# Patient Record
Sex: Female | Born: 1950
Health system: Southern US, Community
[De-identification: ages and names within clinical notes are randomized; demographics above are authoritative.]

## PROBLEM LIST (undated history)

## (undated) DIAGNOSIS — G8929 Other chronic pain: Secondary | ICD-10-CM

## (undated) DIAGNOSIS — E119 Type 2 diabetes mellitus without complications: Secondary | ICD-10-CM

## (undated) DIAGNOSIS — E1140 Type 2 diabetes mellitus with diabetic neuropathy, unspecified: Secondary | ICD-10-CM

## (undated) DIAGNOSIS — I2510 Atherosclerotic heart disease of native coronary artery without angina pectoris: Secondary | ICD-10-CM

## (undated) DIAGNOSIS — K219 Gastro-esophageal reflux disease without esophagitis: Secondary | ICD-10-CM

## (undated) DIAGNOSIS — E785 Hyperlipidemia, unspecified: Secondary | ICD-10-CM

## (undated) HISTORY — DX: Atherosclerotic heart disease of native coronary artery without angina pectoris: I25.10

## (undated) HISTORY — DX: Gastro-esophageal reflux disease without esophagitis: K21.9

## (undated) HISTORY — DX: Type 2 diabetes mellitus without complications: E11.9

## (undated) HISTORY — DX: Other chronic pain: G89.29

## (undated) HISTORY — DX: Hyperlipidemia, unspecified: E78.5

## (undated) HISTORY — DX: Type 2 diabetes mellitus with diabetic neuropathy, unspecified: E11.40

---

## 1991-03-18 HISTORY — PX: CHOLECYSTECTOMY: SHX55

## 2016-01-01 DIAGNOSIS — M5000 Cervical disc disorder with myelopathy, unspecified cervical region: Secondary | ICD-10-CM | POA: Insufficient documentation

## 2016-01-01 DIAGNOSIS — M79605 Pain in left leg: Secondary | ICD-10-CM | POA: Insufficient documentation

## 2016-01-01 DIAGNOSIS — E1142 Type 2 diabetes mellitus with diabetic polyneuropathy: Secondary | ICD-10-CM | POA: Insufficient documentation

## 2016-07-31 DIAGNOSIS — G8929 Other chronic pain: Secondary | ICD-10-CM | POA: Insufficient documentation

## 2016-07-31 DIAGNOSIS — M25512 Pain in left shoulder: Secondary | ICD-10-CM | POA: Insufficient documentation

## 2017-02-26 DIAGNOSIS — Z79891 Long term (current) use of opiate analgesic: Secondary | ICD-10-CM | POA: Insufficient documentation

## 2017-05-28 DIAGNOSIS — M5106 Intervertebral disc disorders with myelopathy, lumbar region: Secondary | ICD-10-CM | POA: Insufficient documentation

## 2019-01-10 DIAGNOSIS — R296 Repeated falls: Secondary | ICD-10-CM | POA: Insufficient documentation

## 2019-01-10 DIAGNOSIS — R42 Dizziness and giddiness: Secondary | ICD-10-CM | POA: Insufficient documentation

## 2019-01-10 DIAGNOSIS — I6782 Cerebral ischemia: Secondary | ICD-10-CM | POA: Insufficient documentation

## 2019-04-22 ENCOUNTER — Telehealth: Payer: Self-pay

## 2019-04-22 ENCOUNTER — Encounter: Payer: Self-pay | Admitting: Primary Care

## 2019-04-22 ENCOUNTER — Other Ambulatory Visit: Payer: Self-pay

## 2019-04-22 ENCOUNTER — Ambulatory Visit (INDEPENDENT_AMBULATORY_CARE_PROVIDER_SITE_OTHER): Payer: Medicare Other | Admitting: Primary Care

## 2019-04-22 VITALS — BP 128/72 | HR 75 | Temp 96.5°F | Ht 63.0 in | Wt 249.0 lb

## 2019-04-22 DIAGNOSIS — I2510 Atherosclerotic heart disease of native coronary artery without angina pectoris: Secondary | ICD-10-CM | POA: Insufficient documentation

## 2019-04-22 DIAGNOSIS — M545 Low back pain: Secondary | ICD-10-CM

## 2019-04-22 DIAGNOSIS — E1149 Type 2 diabetes mellitus with other diabetic neurological complication: Secondary | ICD-10-CM | POA: Diagnosis not present

## 2019-04-22 DIAGNOSIS — E785 Hyperlipidemia, unspecified: Secondary | ICD-10-CM | POA: Diagnosis not present

## 2019-04-22 DIAGNOSIS — E119 Type 2 diabetes mellitus without complications: Secondary | ICD-10-CM | POA: Insufficient documentation

## 2019-04-22 DIAGNOSIS — K219 Gastro-esophageal reflux disease without esophagitis: Secondary | ICD-10-CM

## 2019-04-22 DIAGNOSIS — Z794 Long term (current) use of insulin: Secondary | ICD-10-CM

## 2019-04-22 DIAGNOSIS — E1169 Type 2 diabetes mellitus with other specified complication: Secondary | ICD-10-CM | POA: Diagnosis not present

## 2019-04-22 DIAGNOSIS — G8929 Other chronic pain: Secondary | ICD-10-CM

## 2019-04-22 DIAGNOSIS — M5442 Lumbago with sciatica, left side: Secondary | ICD-10-CM | POA: Insufficient documentation

## 2019-04-22 DIAGNOSIS — E1140 Type 2 diabetes mellitus with diabetic neuropathy, unspecified: Secondary | ICD-10-CM | POA: Insufficient documentation

## 2019-04-22 LAB — LIPID PANEL
Cholesterol: 183 mg/dL (ref 0–200)
HDL: 50.9 mg/dL (ref >39.00)
NonHDL: 131.85
Total CHOL/HDL Ratio: 4
Triglycerides: 342 mg/dL — ABNORMAL HIGH (ref 0.0–149.0)
VLDL: 68.4 mg/dL — ABNORMAL HIGH (ref 0.0–40.0)

## 2019-04-22 LAB — COMPREHENSIVE METABOLIC PANEL
ALT: 15 U/L (ref 0–35)
AST: 12 U/L (ref 0–37)
Albumin: 3.9 g/dL (ref 3.5–5.2)
Alkaline Phosphatase: 71 U/L (ref 39–117)
BUN: 26 mg/dL — ABNORMAL HIGH (ref 6–23)
CO2: 31 mEq/L (ref 19–32)
Calcium: 9.4 mg/dL (ref 8.4–10.5)
Chloride: 103 mEq/L (ref 96–112)
Creatinine, Ser: 0.86 mg/dL (ref 0.40–1.20)
GFR: 65.4 mL/min (ref >60.00)
Glucose, Bld: 205 mg/dL — ABNORMAL HIGH (ref 70–99)
Potassium: 4.4 mEq/L (ref 3.5–5.1)
Sodium: 142 mEq/L (ref 135–145)
Total Bilirubin: 0.2 mg/dL (ref 0.2–1.2)
Total Protein: 6.7 g/dL (ref 6.0–8.3)

## 2019-04-22 LAB — HEMOGLOBIN A1C: Hgb A1c MFr Bld: 6.7 % — ABNORMAL HIGH (ref 4.6–6.5)

## 2019-04-22 LAB — CBC
HCT: 38.1 % (ref 36.0–46.0)
Hemoglobin: 12.8 g/dL (ref 12.0–15.0)
MCHC: 33.7 g/dL (ref 30.0–36.0)
MCV: 96.3 fl (ref 78.0–100.0)
Platelets: 201 10*3/uL (ref 150.0–400.0)
RBC: 3.95 Mil/uL (ref 3.87–5.11)
RDW: 13.6 % (ref 11.5–15.5)
WBC: 7.6 10*3/uL (ref 4.0–10.5)

## 2019-04-22 LAB — MICROALBUMIN / CREATININE URINE RATIO
Creatinine,U: 144.7 mg/dL
Microalb Creat Ratio: 4.2 mg/g (ref 0.0–30.0)
Microalb, Ur: 6.1 mg/dL — ABNORMAL HIGH (ref 0.0–1.9)

## 2019-04-22 LAB — LDL CHOLESTEROL, DIRECT: Direct LDL: 88 mg/dL

## 2019-04-22 NOTE — Progress Notes (Signed)
Subjective:    Patient ID: Michelle Kim, female    DOB: January 12, 1951, 69 y.o.   MRN: [id number]  HPI  This visit occurred during the SARS-CoV-2 public health emergency.  Safety protocols were in place, including screening questions prior to the visit, additional usage of staff PPE, and extensive cleaning of exam room while observing appropriate contact time as indicated for disinfecting solutions.   Michelle Kim is a 69 year old female who presents today to establish care and discuss the problems mentioned below. Will obtain/review records. She is with her daughter who is helping with HPI. Patient doesn't have her medications with her today, moved from [place].  1) Type 2 Diabetes: Diagnosed a few years ago. Currently managed on Trulicity 1.5 mg weekly, Glimepiride 4 mg daily, Actos 30 mg daily, Levemir 10 units daily, insulin Lispro 30 units PRN.   She is not currently using her Levemir. She's using her Lispro infrequently.  She is checking her blood sugars three times daily and is getting readings of:  AM fasting: 170's Before lunch: 140's Bedtime: 120's  Today she checked her blood sugar 30 minutes after eating breakfast which was 330. She ate biscuits. She is also taking carbamazepine 300 mg twice daily for diabetic neuropathy. She cannot take gabapentin due "allery to lactose".   2) Hyperlipidemia: Currently managed on rosuvastatin 10 mg, previously taking Zetia, no longer taking.  3) Chronic Pain: Chronic to upper and lower back. Following with neurology in [place]. Previously managed on Lidocaine patches. Now taking Percocet 10-325 mg every four hours, duloxetine 60 mg BID, meloxicam 7.5 mg BID.  4) CAD: MI in 2003, one stent placed. She is managed on carvedilol 6.25 mg BID. No problems since 2003. She was not seeing cardiology in [place].    5) GERD: Chronic symptoms and is managed on omeprazole 40 mg.   BP Readings from Last 3 Encounters:  04/22/19 128/72     Review of  Systems  Eyes: Negative for visual disturbance.  Respiratory: Negative for shortness of breath.   Cardiovascular: Negative for chest pain.  Musculoskeletal: Positive for arthralgias and back pain.  Neurological: Negative for dizziness and headaches.       No past medical history on file.   Social History   Socioeconomic History  . Marital status: Widowed    Spouse name: Not on file  . Number of children: Not on file  . Years of education: Not on file  . Highest education level: Not on file  Occupational History  . Not on file  Tobacco Use  . Smoking status: Former Research scientist (life sciences)  . Smokeless tobacco: Never Used  Substance and Sexual Activity  . Alcohol use: Not on file  . Drug use: Not on file  . Sexual activity: Not on file  Other Topics Concern  . Not on file  Social History Narrative  . Not on file   Social Determinants of Health   Financial Resource Strain:   . Difficulty of Paying Living Expenses: Not on file  Food Insecurity:   . Worried About Charity fundraiser in the Last Year: Not on file  . Ran Out of Food in the Last Year: Not on file  Transportation Needs:   . Lack of Transportation (Medical): Not on file  . Lack of Transportation (Non-Medical): Not on file  Physical Activity:   . Days of Exercise per Week: Not on file  . Minutes of Exercise per Session: Not on file  Stress:   .  Feeling of Stress : Not on file  Social Connections:   . Frequency of Communication with Friends and Family: Not on file  . Frequency of Social Gatherings with Friends and Family: Not on file  . Attends Religious Services: Not on file  . Active Member of Clubs or Organizations: Not on file  . Attends Archivist Meetings: Not on file  . Marital Status: Not on file  Intimate Partner Violence:   . Fear of Current or Ex-Partner: Not on file  . Emotionally Abused: Not on file  . Physically Abused: Not on file  . Sexually Abused: Not on file    No family history on  file.  Allergies  Allergen Reactions  . Aspirin   . Codeine   . Latex     Current Outpatient Medications on File Prior to Visit  Medication Sig Dispense Refill  . Blood Glucose Monitoring Suppl (ONETOUCH VERIO) w/Device KIT by Does not apply route.    . calcipotriene (DOVONOX) 0.005 % ointment APPLY AND GENTLY MASSAGE INTO AFFECTED AREA TWICE DAILY    . carbamazepine (CARBATROL) 300 MG 12 hr capsule Take 300 mg by mouth daily.    . carvedilol (COREG) 6.25 MG tablet Take 6.25 mg by mouth 2 (two) times daily with a meal.    . Cholecalciferol (VITAMIN D3) 50 MCG (2000 UT) TABS Take 2,000 Units by mouth daily.    Marland Kitchen docusate sodium (COLACE) 100 MG capsule Take 100 mg by mouth 2 (two) times daily.    . Dulaglutide (TRULICITY) 1.5 GB/1.5VV SOPN Inject into the skin.    . DULoxetine (CYMBALTA) 60 MG capsule Take 120 mg by mouth at bedtime.    . fexofenadine (ALLEGRA) 180 MG tablet Take 180 mg by mouth daily.    Marland Kitchen glimepiride (AMARYL) 4 MG tablet Take 4 mg by mouth daily with breakfast.    . Insulin Pen Needle (RELION MINI PEN NEEDLES) 31G X 6 MM MISC by Does not apply route. Use 3 pen needles daily    . Lancet Devices (ONETOUCH DELICA PLUS LANCING) MISC by Does not apply route. USE AS DIRECTED 4 TIMES DAILY    . lidocaine (LIDODERM) 5 % APPLY 1 PATCH TO THE AFFECTED AREA AND LEAVE IN PLACE FOR 12 HOURS,  THEN REMOVE AND LEAVE OFF FOR 12 HOURS    . meloxicam (MOBIC) 7.5 MG tablet Take 7.5 mg by mouth 2 (two) times daily as needed for pain.    Marland Kitchen omega-3 acid ethyl esters (LOVAZA) 1 g capsule Take 2 g by mouth 2 (two) times daily.    Marland Kitchen omeprazole (PRILOSEC) 40 MG capsule Take 40 mg by mouth daily.    Marland Kitchen oxyCODONE-acetaminophen (PERCOCET) 10-325 MG tablet Take 1 tablet by mouth every 4 (four) hours as needed.    . pioglitazone (ACTOS) 30 MG tablet Take 30 mg by mouth daily.    . rosuvastatin (CRESTOR) 10 MG tablet Take 10 mg by mouth daily.    Marland Kitchen tiZANidine (ZANAFLEX) 4 MG tablet Take 4 mg by mouth  every 6 (six) hours as needed.    . insulin lispro (HUMALOG) 100 UNIT/ML injection Inject 10 Units into the skin 3 (three) times daily before meals.     No current facility-administered medications on file prior to visit.    BP 128/72   Pulse 75   Temp (!) 96.5 F (35.8 C) (Temporal)   Ht '5\' 3"'  (1.6 m)   Wt 249 lb (112.9 kg)   SpO2 95%  BMI 44.11 kg/m    Objective:   Physical Exam  Constitutional: She appears well-nourished.  Cardiovascular: Normal rate and regular rhythm.  Respiratory: Effort normal and breath sounds normal.  Musculoskeletal:     Cervical back: Neck supple.  Skin: Skin is warm and dry.  Psychiatric: She has a normal mood and affect.           Assessment & Plan:

## 2019-04-22 NOTE — Assessment & Plan Note (Signed)
Chronic, on high dose of PPI daily, cannot (and refuses) to reduce dose. Continue to monitor with a goal of dose reduction.

## 2019-04-22 NOTE — Telephone Encounter (Signed)
Error

## 2019-04-22 NOTE — Assessment & Plan Note (Signed)
History of MI in 2003, stent placed. Compliant to carvedilol, continue same.  Asymptomatic.

## 2019-04-22 NOTE — Telephone Encounter (Signed)
Noted and appreciate the update! 

## 2019-04-22 NOTE — Assessment & Plan Note (Signed)
Managed on carbamazepine 300 mg BID. Continue same for now. Could not tolerate gabapentin due to "lactose" allergy.

## 2019-04-22 NOTE — Assessment & Plan Note (Signed)
Compliant to rosuvastatin, no longer on Zetia. Repeat lipids pending.

## 2019-04-22 NOTE — Patient Instructions (Signed)
Stop by the lab prior to leaving today. I will notify you of your results once received.   You will be contacted regarding your referral to pain management.  Please let us know if you have not been contacted within two weeks.   It was a pleasure to meet you today! Please don't hesitate to call or message me with any questions. Welcome to [facility]!

## 2019-04-22 NOTE — Assessment & Plan Note (Addendum)
A1C, Lipids, urine microalbumin pending. Compliant to Actos, glimepiride, Trulicity. Unclear why she's just using Lispro and not longer acting insulin for control.  Consider stopping Lispro, adding back Levemir. Await labs.

## 2019-04-22 NOTE — Telephone Encounter (Signed)
Patient's daughter contacted the office and said that Jae Dire asked her to contact the office to let us know the dose of her Trulicity which is 1.5mg /.45ml - single dose pen once a week. Will route to [place]. Thanks!

## 2019-04-22 NOTE — Assessment & Plan Note (Addendum)
On scheduled opioid therapy, referral placed to pain management.   Continue duloxetine, tizanidine, meloxicam for now. CMP pending.

## 2019-04-25 ENCOUNTER — Telehealth: Payer: Self-pay | Admitting: Primary Care

## 2019-04-25 NOTE — Telephone Encounter (Signed)
Rec'd from [facility] forwarded 33 pages to Dr. Vernona Rieger

## 2019-04-27 ENCOUNTER — Telehealth: Payer: Self-pay | Admitting: Primary Care

## 2019-04-27 NOTE — Telephone Encounter (Signed)
Please notify patient:  Labs show that diabetes is overall controlled so I want her to continue taking Glimepiride, Trulicity, Actos. I don't want her using the Lispro insulin, have her stop this. If her blood sugars are consistently at or above 200 then I recommend we resume her Levemir. Have them update me if this occurs.  Urine test was negative for kidney damage. Cholesterol looks good. Continue rosuvastatin. Blood counts look good.  I want to see her back in 3 months for diabetes follow up. Please schedule. Have them update me sooner regarding blood sugars >200.

## 2019-04-27 NOTE — Telephone Encounter (Signed)
Message left for patient to return my call.  

## 2019-04-29 ENCOUNTER — Other Ambulatory Visit: Payer: Self-pay | Admitting: Primary Care

## 2019-04-29 DIAGNOSIS — Z0000 Encounter for general adult medical examination without abnormal findings: Secondary | ICD-10-CM

## 2019-04-29 NOTE — Telephone Encounter (Signed)
Spoken and notified patient of Michelle Kim comments. Patient verbalized understanding. Patient's daughter stated that she will call to schedule the follow up after she check her work schedule

## 2019-05-11 ENCOUNTER — Telehealth: Payer: Self-pay

## 2019-05-11 DIAGNOSIS — E1169 Type 2 diabetes mellitus with other specified complication: Secondary | ICD-10-CM

## 2019-05-11 DIAGNOSIS — M545 Low back pain, unspecified: Secondary | ICD-10-CM

## 2019-05-11 DIAGNOSIS — G8929 Other chronic pain: Secondary | ICD-10-CM

## 2019-05-11 NOTE — Telephone Encounter (Signed)
Pt requesting refills pioglitazone 30 mg and glimepiride 4 mg to [facility]. Pt will be out of oxycodone apap 10-325 mg  In  4- 5 days. Pt does not have appt with pain mgt until 05/24/19 and pt wants to know if can refill oxycodone apap until pt sees pain mgt. [facility] pharmacy

## 2019-05-11 NOTE — Telephone Encounter (Signed)
Please advise 

## 2019-05-12 MED ORDER — PIOGLITAZONE HCL 30 MG PO TABS: 30.0000 mg | ORAL_TABLET | Freq: Every day | ORAL | 3 refills | Status: AC

## 2019-05-12 MED ORDER — OXYCODONE-ACETAMINOPHEN 10-325 MG PO TABS: 1.0000 | ORAL_TABLET | Freq: Three times a day (TID) | ORAL | 0 refills | Status: DC | PRN

## 2019-05-12 MED ORDER — GLIMEPIRIDE 4 MG PO TABS: 4.0000 mg | ORAL_TABLET | Freq: Every day | ORAL | 3 refills | Status: AC

## 2019-05-12 NOTE — Telephone Encounter (Signed)
Noted, refill sent to pharmacy. Reviewed PMP aware site for [place], no suspicious activity. Nothing on [place] PMP database.

## 2019-05-12 NOTE — Telephone Encounter (Signed)
Spoken and notified patient of Michelle Kim comments. Patient verbalized understanding and takes the oxycodone-acetaminophen 3 times a day.

## 2019-05-12 NOTE — Telephone Encounter (Signed)
I sent refills for pioglitazone and glimepiride to your pharmacy. I will temporarily refill her oxycodone-acetaminophen until she can get in with pain management. How many tablets of her oxycodone-acetaminophen that she take in 1 day?  2?  3?

## 2019-05-23 ENCOUNTER — Telehealth: Payer: Self-pay | Admitting: Primary Care

## 2019-05-23 ENCOUNTER — Other Ambulatory Visit: Payer: Self-pay | Admitting: Primary Care

## 2019-05-23 DIAGNOSIS — Z794 Long term (current) use of insulin: Secondary | ICD-10-CM

## 2019-05-23 DIAGNOSIS — E1169 Type 2 diabetes mellitus with other specified complication: Secondary | ICD-10-CM

## 2019-05-23 MED ORDER — TRULICITY 1.5 MG/0.5ML ~~LOC~~ SOAJ: 1.5000 mg | SUBCUTANEOUS | 3 refills | Status: DC

## 2019-05-23 NOTE — Telephone Encounter (Signed)
Have not prescribed. Last appointment on 04/22/2019 (est care). No future appointment

## 2019-05-23 NOTE — Progress Notes (Signed)
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 lb 8.3 oz) Adjusted ideal body weight: 75.9 kg (167 lb 5 oz)   BMI Readings from Last 4 Encounters:  05/24/19 43.40 kg/m  04/22/19 44.11 kg/m   Wt Readings from Last 4 Encounters:  05/24/19 245 lb (111.1 kg)  04/22/19 249 lb (  112.9 kg)    Psych/Mental status: Alert, oriented x 3 (person, place, & time)       Eyes: PERLA Respiratory: No evidence of acute respiratory distress  Cervical Spine Exam  Skin & Axial Inspection: No masses, redness, edema, swelling, or associated skin lesions Alignment: Symmetrical Functional ROM: Pain restricted ROM, bilaterally Stability: No instability detected Muscle Tone/Strength: Guarding observed Sensory (Neurological): Movement-associated pain Palpation: Complains of area being tender to palpation              Upper Extremity (UE) Exam    Side: Right upper extremity  Side: Left upper extremity  Skin & Extremity Inspection: Skin color, temperature, and hair growth are WNL. No peripheral edema or cyanosis. No masses, redness, swelling, asymmetry, or associated skin lesions. No  contractures.  Skin & Extremity Inspection: Skin color, temperature, and hair growth are WNL. No peripheral edema or cyanosis. No masses, redness, swelling, asymmetry, or associated skin lesions. No contractures.  Functional ROM: Decreased ROM for shoulder  Functional ROM: Decreased ROM for shoulder  Muscle Tone/Strength: Functionally intact. No obvious neuro-muscular anomalies detected.  Muscle Tone/Strength: Functionally intact. No obvious neuro-muscular anomalies detected.  Sensory (Neurological): Movement-associated discomfort affecting the shoulder  Sensory (Neurological): Movement-associated pain affecting the shoulder  Palpation: Complains of area being tender to palpation              Palpation: Complains of area being tender to palpation              Provocative Test(s):  Phalen's test: deferred Tinel's test: deferred Apley's scratch test (touch opposite shoulder):  Action 1 (Across chest): deferred Action 2 (Overhead): deferred Action 3 (LB reach): deferred   Provocative Test(s):  Phalen's test: deferred Tinel's test: deferred Apley's scratch test (touch opposite shoulder):  Action 1 (Across chest): deferred Action 2 (Overhead): deferred Action 3 (LB reach): deferred    Thoracic Spine Area Exam  Skin & Axial Inspection: No masses, redness, or swelling Alignment: Symmetrical Functional ROM: Unrestricted ROM Stability: No instability detected Muscle Tone/Strength: Functionally intact. No obvious neuro-muscular anomalies detected. Sensory (Neurological): Movement associated discomfort Muscle strength & Tone: Complains of area being tender to palpation  Lumbar Exam  Skin & Axial Inspection: No masses, redness, or swelling Alignment: Symmetrical Functional ROM: Guarding affecting both sides Stability: No instability detected Muscle Tone/Strength: Functionally intact. No obvious neuro-muscular anomalies detected. Sensory (Neurological): Unimpaired Palpation: Complains of area  being tender to palpation       Provocative Tests: Hyperextension/rotation test: Positive bilaterally for facet joint pain. Lumbar quadrant test (Kemp's test): (+) on the left for facet joint pain. Lateral bending test: deferred today       Patrick's Maneuver: deferred today                   FABER* test: deferred today                   S-I anterior distraction/compression test: deferred today         S-I lateral compression test: deferred today         S-I Thigh-thrust test: deferred today         S-I Gaenslen's test: deferred today         *(Flexion, ABduction and External Rotation)  Gait & Posture Assessment  Ambulation: Patient came in today in a wheel chair Gait: Antalgic Posture: Difficulty standing up straight, due to pain   Lower Extremity Exam    Side: Right lower extremity  Side: Left  lower extremity  Stability: No instability observed          Stability: No instability observed          Skin & Extremity Inspection: Skin color, temperature, and hair growth are WNL. No peripheral edema or cyanosis. No masses, redness, swelling, asymmetry, or associated skin lesions. No contractures.  Skin & Extremity Inspection: Skin color, temperature, and hair growth are WNL. No peripheral edema or cyanosis. No masses, redness, swelling, asymmetry, or associated skin lesions. No contractures.  Functional ROM: Decreased ROM for hip and knee joints          Functional ROM: Decreased ROM for hip and knee joints          Muscle Tone/Strength: S1 myotomal weakness (Foot Plantar Flexion)  Muscle Tone/Strength: S1 myotomal weakness (Foot Plantar Flexion)  Sensory (Neurological): Movement-associated discomfort        Sensory (Neurological): Movement-associated discomfort        DTR: Patellar: 2+: normal Achilles: 0: absent Plantar: deferred today  DTR: Patellar: 2+: normal Achilles: 0: absent Plantar: deferred today  Palpation: No palpable anomalies  Palpation: No palpable anomalies    Assessment  Primary Diagnosis & Pertinent Problem List: The primary encounter diagnosis was Chronic low back pain (1ry area of Pain) (Bilateral) (L>R) w/ sciatica (Bilateral). 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), Chronic lower extremity pain (3ry area of Pain) (Bilateral) (L>R), Chronic hip pain (Bilateral) (L>R), Chronic knee pain (Bilateral) (L>R), Osteoarthritis of knees (Medial Compartment) (Bilateral), Chronic neck and back pain (4th area of Pain) (Bilateral) (L>R), DDD (degenerative disc disease), cervical, Cervicalgia, Cervical Grade 1 Anterolisthesis of C4/C5 (2 mm), Subluxation of C4/C5 cervical vertebrae, sequela (Bilateral) (L>R), Cervical facet hypertrophy (C5-6) (Left), Cervical facet arthropathy (C4-5) (Bilateral) (L>R), Occipital headache (Bilateral), Chronic upper extremity pain (5th area of Pain) (Bilateral) (L>R), Chronic upper back pain (Bilateral), Chronic pain syndrome, Chronic musculoskeletal pain, Chronic peripheral neuropathic pain, 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  Visit Diagnosis (New problems to examiner): 1. Chronic low back pain (1ry area of Pain) (Bilateral) (L>R) w/ sciatica (Bilateral)   2. Spasm of back muscles   3. Chronic shoulder pain (2ry area of Pain) (Bilateral) (L>R)   4. 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 Chronic lower extremity pain (3ry area of Pain) (Bilateral) (L>R)   6. Chronic hip pain (Bilateral) (L>R)   7. Chronic knee pain (Bilateral) (L>R)   8. Osteoarthritis of knees (Medial Compartment) (Bilateral)   9. Chronic neck and back pain (4th area of Pain) (Bilateral) (L>R)    10. DDD (degenerative disc disease), cervical   11. Cervicalgia   12. Cervical Grade 1 Anterolisthesis of C4/C5 (2 mm)   13. Subluxation of C4/C5 cervical vertebrae, sequela (Bilateral) (L>R)   14. Cervical facet hypertrophy (C5-6) (Left)   15. Cervical facet arthropathy (C4-5) (Bilateral) (L>R)   16. Occipital headache (Bilateral)   17. Chronic upper extremity pain (5th area of Pain) (Bilateral) (L>R)   18. Chronic upper back pain (Bilateral)   19. Chronic pain syndrome   20. Chronic musculoskeletal pain   21. Chronic peripheral neuropathic pain   22. 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 Pharmacologic therapy   24. Physical tolerance to opiate drug   25. Disorder of skeletal system   26. Problems influencing health status   27. Morbid obesity with body mass index (BMI) of 40.0 to 49.9 ([place])    Plan of Care (Initial workup plan)  Note: Ms. Carollo was reminded that as per protocol, today's visit has been an evaluation only. We have not taken over the patient's controlled substance management.  Problem-specific plan: No problem-specific Assessment & Plan notes found for this encounter.   Lab Orders     Compliance Drug Analysis, Ur     Magnesium     Vitamin B12     Sedimentation rate     25-Hydroxy vitamin D Lcms D2+D3     C-reactive protein  Imaging Orders     DG Cervical Spine With Flex & Extend     DG HIP UNILAT W OR W/O PELVIS 2-3 VIEWS RIGHT     DG HIP UNILAT W OR W/O PELVIS 2-3 VIEWS LEFT     DG Knee 1-2 Views Right     DG Knee 1-2 Views Left     DG Lumbar Spine Complete W/Bend     DG Thoracic Spine 2 View     DG Shoulder Right     DG Shoulder Left Referral Orders  No referral(s) requested today   Procedure Orders    No procedure(s) ordered today   Pharmacotherapy (current): Medications ordered:  No orders of the defined types were placed in this encounter.  Medications administered during this visit: Torie Towle had no  medications administered during this visit.   Pharmacological management options:  Opioid Analgesics: The patient was informed that there is no guarantee that she would be a candidate for opioid analgesics. The decision will be made following CDC guidelines. This decision will be based on the results of diagnostic studies, as well as Ms. Bruening's risk profile.   Membrane stabilizer: To be determined at a later time  Muscle relaxant: To be determined at a later time  NSAID: To be determined at a later time  Other analgesic(s): To be determined at a later time   Interventional management options: Ms. Monica was informed that there is no guarantee that she would be a candidate for interventional therapies. The decision will be based on the results of diagnostic studies, as well as Ms. Brasington's risk profile.  Procedure(s) under consideration:  Diagnostic bilateral cervical facet blocks  Possible bilateral cervical facet RFA  Diagnostic bilateral IA shoulder joint injection  Diagnostic bilateral suprascapular nerve block  Possible bilateral suprascapular nerve RFA  Diagnostic bilateral IA steroid knee injection  Possible bilateral IA Hyalgan knee injection  Diagnostic bilateral genicular nerve blocks  Possible bilateral genicular nerve RFA     Provider-requested follow-up: Return for (F2F), (2V), (s/p Tests).  No future appointments.  Note by: Gaspar Cola, MD Date: 05/24/2019; Time: 3:04 PM

## 2019-05-23 NOTE — Telephone Encounter (Signed)
If she ran out of her omeprazole then I recommend we try a dose reduction to 20 mg. Is she agreeable? It can be unsafe to be on high doses of that medication for prolonged periods of time.

## 2019-05-23 NOTE — Telephone Encounter (Signed)
Pt needs refill on trulicity and omeprzaole [facility]  Pt is out of omeprazole and 1 dose of trulicity left

## 2019-05-23 NOTE — Telephone Encounter (Signed)
Left message asking pt to call office See Zella Ball

## 2019-05-24 ENCOUNTER — Encounter: Payer: Self-pay | Admitting: Pain Medicine

## 2019-05-24 ENCOUNTER — Ambulatory Visit: Payer: Medicare Other | Attending: Pain Medicine | Admitting: Pain Medicine

## 2019-05-24 ENCOUNTER — Other Ambulatory Visit: Payer: Self-pay

## 2019-05-24 VITALS — BP 146/77 | HR 102 | Temp 97.7°F | Resp 18 | Ht 63.0 in | Wt 245.0 lb

## 2019-05-24 DIAGNOSIS — M25511 Pain in right shoulder: Secondary | ICD-10-CM | POA: Insufficient documentation

## 2019-05-24 DIAGNOSIS — M25552 Pain in left hip: Secondary | ICD-10-CM | POA: Diagnosis not present

## 2019-05-24 DIAGNOSIS — Z79899 Other long term (current) drug therapy: Secondary | ICD-10-CM | POA: Diagnosis not present

## 2019-05-24 DIAGNOSIS — Z789 Other specified health status: Secondary | ICD-10-CM

## 2019-05-24 DIAGNOSIS — S13150S Subluxation of C4/C5 cervical vertebrae, sequela: Secondary | ICD-10-CM | POA: Diagnosis not present

## 2019-05-24 DIAGNOSIS — E1142 Type 2 diabetes mellitus with diabetic polyneuropathy: Secondary | ICD-10-CM | POA: Diagnosis present

## 2019-05-24 DIAGNOSIS — M542 Cervicalgia: Secondary | ICD-10-CM | POA: Insufficient documentation

## 2019-05-24 DIAGNOSIS — M899 Disorder of bone, unspecified: Secondary | ICD-10-CM

## 2019-05-24 DIAGNOSIS — M62830 Muscle spasm of back: Secondary | ICD-10-CM | POA: Diagnosis not present

## 2019-05-24 DIAGNOSIS — M4310 Spondylolisthesis, site unspecified: Secondary | ICD-10-CM | POA: Diagnosis not present

## 2019-05-24 DIAGNOSIS — M7918 Myalgia, other site: Secondary | ICD-10-CM

## 2019-05-24 DIAGNOSIS — M5441 Lumbago with sciatica, right side: Secondary | ICD-10-CM

## 2019-05-24 DIAGNOSIS — F1190 Opioid use, unspecified, uncomplicated: Secondary | ICD-10-CM

## 2019-05-24 DIAGNOSIS — M170 Bilateral primary osteoarthritis of knee: Secondary | ICD-10-CM

## 2019-05-24 DIAGNOSIS — R519 Headache, unspecified: Secondary | ICD-10-CM

## 2019-05-24 DIAGNOSIS — R269 Unspecified abnormalities of gait and mobility: Secondary | ICD-10-CM | POA: Insufficient documentation

## 2019-05-24 DIAGNOSIS — G894 Chronic pain syndrome: Secondary | ICD-10-CM

## 2019-05-24 DIAGNOSIS — M79601 Pain in right arm: Secondary | ICD-10-CM

## 2019-05-24 DIAGNOSIS — M4802 Spinal stenosis, cervical region: Secondary | ICD-10-CM

## 2019-05-24 DIAGNOSIS — M47812 Spondylosis without myelopathy or radiculopathy, cervical region: Secondary | ICD-10-CM | POA: Insufficient documentation

## 2019-05-24 DIAGNOSIS — M25561 Pain in right knee: Secondary | ICD-10-CM

## 2019-05-24 DIAGNOSIS — M549 Dorsalgia, unspecified: Secondary | ICD-10-CM

## 2019-05-24 DIAGNOSIS — M25551 Pain in right hip: Secondary | ICD-10-CM | POA: Diagnosis not present

## 2019-05-24 DIAGNOSIS — G8929 Other chronic pain: Secondary | ICD-10-CM | POA: Insufficient documentation

## 2019-05-24 DIAGNOSIS — M79605 Pain in left leg: Secondary | ICD-10-CM | POA: Diagnosis not present

## 2019-05-24 DIAGNOSIS — M25562 Pain in left knee: Secondary | ICD-10-CM | POA: Diagnosis not present

## 2019-05-24 DIAGNOSIS — M25512 Pain in left shoulder: Secondary | ICD-10-CM | POA: Diagnosis not present

## 2019-05-24 DIAGNOSIS — M79602 Pain in left arm: Secondary | ICD-10-CM | POA: Diagnosis present

## 2019-05-24 DIAGNOSIS — M792 Neuralgia and neuritis, unspecified: Secondary | ICD-10-CM

## 2019-05-24 DIAGNOSIS — M5442 Lumbago with sciatica, left side: Secondary | ICD-10-CM | POA: Diagnosis not present

## 2019-05-24 DIAGNOSIS — M5030 Other cervical disc degeneration, unspecified cervical region: Secondary | ICD-10-CM

## 2019-05-24 DIAGNOSIS — M79604 Pain in right leg: Secondary | ICD-10-CM | POA: Diagnosis not present

## 2019-05-24 NOTE — Telephone Encounter (Signed)
Patient stated that she was 20 mg before and it was increased to 40 mg. Patient wanted to know is there something better that patient can take instead. Because she would get up at due reflux and take some Tums.  Please advise

## 2019-05-24 NOTE — Patient Instructions (Addendum)
____________________________________________________________________________________________  General Risks and Possible Complications  Patient Responsibilities: It is important that you read this as it is part of your informed consent. It is our duty to inform you of the risks and possible complications associated with treatments offered to you. It is your responsibility as a patient to read this and to ask questions about anything that is not clear or that you believe was not covered in this document.  Patient's Rights: You have the right to refuse treatment. You also have the right to change your mind, even after initially having agreed to have the treatment done. However, under this last option, if you wait until the last second to change your mind, you may be charged for the materials used up to that point.  Introduction: Medicine is not an exact science. Everything in Medicine, including the lack of treatment(s), carries the potential for danger, harm, or loss (which is by definition: Risk). In Medicine, a complication is a secondary problem, condition, or disease that can aggravate an already existing one. All treatments carry the risk of possible complications. The fact that a side effects or complications occurs, does not imply that the treatment was conducted incorrectly. It must be clearly understood that these can happen even when everything is done following the highest safety standards.  No treatment: You can choose not to proceed with the proposed treatment alternative. The "PRO(s)" would include: avoiding the risk of complications associated with the therapy. The "CON(s)" would include: not getting any of the treatment benefits. These benefits fall under one of three categories: diagnostic; therapeutic; and/or palliative. Diagnostic benefits include: getting information which can ultimately lead to improvement of the disease or symptom(s). Therapeutic benefits are those associated with the  successful treatment of the disease. Finally, palliative benefits are those related to the decrease of the primary symptoms, without necessarily curing the condition (example: decreasing the pain from a flare-up of a chronic condition, such as incurable terminal cancer).  General Risks and Complications: These are associated to most interventional treatments. They can occur alone, or in combination. They fall under one of the following six (6) categories: no benefit or worsening of symptoms; bleeding; infection; nerve damage; allergic reactions; and/or death. 1. No benefits or worsening of symptoms: In Medicine there are no guarantees, only probabilities. No healthcare provider can ever guarantee that a medical treatment will work, they can only state the probability that it may. Furthermore, there is always the possibility that the condition may worsen, either directly, or indirectly, as a consequence of the treatment. 2. Bleeding: This is more common if the patient is taking a blood thinner, either prescription or over the counter (example: Goody Powders, Fish oil, Aspirin, Garlic, etc.), or if suffering a condition associated with impaired coagulation (example: Hemophilia, cirrhosis of the liver, low platelet counts, etc.). However, even if you do not have one on these, it can still happen. If you have any of these conditions, or take one of these drugs, make sure to notify your treating physician. 3. Infection: This is more common in patients with a compromised immune system, either due to disease (example: diabetes, cancer, human immunodeficiency virus [HIV], etc.), or due to medications or treatments (example: therapies used to treat cancer and rheumatological diseases). However, even if you do not have one on these, it can still happen. If you have any of these conditions, or take one of these drugs, make sure to notify your treating physician. 4. Nerve Damage: This is more common when the   treatment is  an invasive one, but it can also happen with the use of medications, such as those used in the treatment of cancer. The damage can occur to small secondary nerves, or to large primary ones, such as those in the spinal cord and brain. This damage may be temporary or permanent and it may lead to impairments that can range from temporary numbness to permanent paralysis and/or brain death. 5. Allergic Reactions: Any time a substance or material comes in contact with our body, there is the possibility of an allergic reaction. These can range from a mild skin rash (contact dermatitis) to a severe systemic reaction (anaphylactic reaction), which can result in death. 6. Death: In general, any medical intervention can result in death, most of the time due to an unforeseen complication. ____________________________________________________________________________________________    ____________________________________________________________________________________________  Muscle Spasms & Cramps  Cause:  The most common cause of muscle spasms and cramps is vitamin and/or electrolyte (calcium, potassium, sodium, etc.) deficiencies.  Possible triggers: Sweating - causes loss of electrolytes thru the skin. Steroids - causes loss of electrolytes thru the urine.  Treatment: 1. Gatorade (or any other electrolyte-replenishing drink) - Take 1, 8 oz glass with each meal (3 times a day). 2. OTC (over-the-counter) Magnesium 400 to 500 mg - Take 1 tablet twice a day (one with breakfast and one before bedtime). If you have kidney problems, talk to your primary care physician before taking any Magnesium. 3. Tonic Water with quinine - Take 1, 8 oz glass before bedtime.   ____________________________________________________________________________________________   ____________________________________________________________________________________________  Drug Holidays (Slow)  What is a "Drug Holiday"? Drug  Holiday: is the name given to the period of time during which a patient stops taking a medication(s) for the purpose of eliminating tolerance to the drug.  Benefits . Improved effectiveness of opioids. . Decreased opioid dose needed to achieve benefits. . Improved pain with lesser dose.  What is tolerance? Tolerance: is the progressive decreased in effectiveness of a drug due to its repetitive use. With repetitive use, the body gets use to the medication and as a consequence, it loses its effectiveness. This is a common problem seen with opioid pain medications. As a result, a larger dose of the drug is needed to achieve the same effect that used to be obtained with a smaller dose.  How long should a "Drug Holiday" last? You should stay off of the pain medicine for at least 14 consecutive days. (2 weeks)  Should I stop the medicine "cold [place]"? No. You should always coordinate with your Pain Specialist so that he/she can provide you with the correct medication dose to make the transition as smoothly as possible.  How do I stop the medicine? Slowly. You will be instructed to decrease the daily amount of pills that you take by one (1) pill every seven (7) days. This is called a "slow downward taper" of your dose. For example: if you normally take four (4) pills per day, you will be asked to drop this dose to three (3) pills per day for seven (7) days, then to two (2) pills per day for seven (7) days, then to one (1) per day for seven (7) days, and at the end of those last seven (7) days, this is when the "Drug Holiday" would start.   Will I have withdrawals? By doing a "slow downward taper" like this one, it is unlikely that you will experience any significant withdrawal symptoms. Typically, what triggers withdrawals is the sudden  stop of a high dose opioid therapy. Withdrawals can usually be avoided by slowly decreasing the dose over a prolonged period of time.  What are  withdrawals? Withdrawals: refers to the wide range of symptoms that occur after stopping or dramatically reducing opiate drugs after heavy and prolonged use. Withdrawal symptoms do not occur to patients that use low dose opioids, or those who take the medication sporadically. Contrary to benzodiazepine (example: Valium, Xanax, etc.) or alcohol withdrawals ("Delirium Tremens"), opioid withdrawals are not lethal. Withdrawals are the physical manifestation of the body getting rid of the excess receptors.  Expected Symptoms Early symptoms of withdrawal may include: . Agitation . Anxiety . Muscle aches . Increased tearing . Insomnia . Runny nose . Sweating . Yawning  Late symptoms of withdrawal may include: . Abdominal cramping . Diarrhea . Dilated pupils . Goose bumps . Nausea . Vomiting  Will I experience withdrawals? Due to the slow nature of the taper, it is very unlikely that you will experience any.  What is a slow taper? Taper: refers to the gradual decrease in dose.  ___________________________________________________________________________________________    ____________________________________________________________________________________________  CANNABIDIOL (AKA: CBD Oil or Pills)  Applies to: All patients receiving prescriptions of controlled substances (written and/or electronic).  General Information: Cannabidiol (CBD) was discovered in 55. It is one of some 113 identified cannabinoids in cannabis (Marijuana) plants, accounting for up to 40% of the plant's extract. As of 2018, preliminary clinical research on cannabidiol included studies of anxiety, cognition, movement disorders, and pain.  Cannabidiol is consummed in multiple ways, including inhalation of cannabis smoke or vapor, as an aerosol spray into the cheek, and by mouth. It may be supplied as CBD oil containing CBD as the active ingredient (no added tetrahydrocannabinol (THC) or terpenes), a full-plant  CBD-dominant hemp extract oil, capsules, dried cannabis, or as a liquid solution. CBD is thought not have the same psychoactivity as THC, and may affect the actions of THC. Studies suggest that CBD may interact with different biological targets, including cannabinoid receptors and other neurotransmitter receptors. As of 2018 the mechanism of action for its biological effects has not been determined.  In the [place], cannabidiol has a limited approval by the Food and Drug Administration (FDA) for treatment of only two types of epilepsy disorders. The side effects of long-term use of the drug include somnolence, decreased appetite, diarrhea, fatigue, malaise, weakness, sleeping problems, and others.  CBD remains a Schedule I drug prohibited for any use.  Legality: Some manufacturers ship CBD products nationally, an illegal action which the FDA has not enforced in 2018, with CBD remaining the subject of an FDA investigational new drug evaluation, and is not considered legal as a dietary supplement or food ingredient as of December 2018. Federal illegality has made it difficult historically to conduct research on CBD. CBD is openly sold in head shops and health food stores in some states where such sales have not been explicitly legalized.  Warning: Because it is not FDA approved for general use or treatment of pain, it is not required to undergo the same manufacturing controls as prescription drugs.  This means that the available cannabidiol (CBD) may be contaminated with THC.  If this is the case, it will trigger a positive urine drug screen (UDS) test for cannabinoids (Marijuana).  Because a positive UDS for illicit substances is a violation of our medication agreement, your opioid analgesics (pain medicine) may be permanently discontinued. (Last update:  06/04/2017) ____________________________________________________________________________________________   ____________________________________________________________________________________________  Medication Rules  Purpose: To inform patients, and their family members, of our rules and regulations.  Applies to: All patients receiving prescriptions (written or electronic).  Pharmacy of record: Pharmacy where electronic prescriptions will be sent. If written prescriptions are taken to a different pharmacy, please inform the nursing staff. The pharmacy listed in the electronic medical record should be the one where you would like electronic prescriptions to be sent.  Electronic prescriptions: In compliance with the [facility] Strengthen Opioid Misuse Prevention (STOP) Act of 2017 (Session Conni Elliot [phone]), effective March 17, 2018, all controlled substances must be electronically prescribed. Calling prescriptions to the pharmacy will cease to exist.  Prescription refills: Only during scheduled appointments. Applies to all prescriptions.  NOTE: The following applies primarily to controlled substances (Opioid* Pain Medications).   Patient's responsibilities: 1. Pain Pills: Bring all pain pills to every appointment (except for procedure appointments). 2. Pill Bottles: Bring pills in original pharmacy bottle. Always bring the newest bottle. Bring bottle, even if empty. 3. Medication refills: You are responsible for knowing and keeping track of what medications you take and those you need refilled. The day before your appointment: write a list of all prescriptions that need to be refilled. The day of the appointment: give the list to the admitting nurse. Prescriptions will be written only during appointments. No prescriptions will be written on procedure days. If you forget a medication: it will not be "Called in", "Faxed", or "electronically sent". You will need to get another  appointment to get these prescribed. No early refills. Do not call asking to have your prescription filled early. 4. Prescription Accuracy: You are responsible for carefully inspecting your prescriptions before leaving our office. Have the discharge nurse carefully go over each prescription with you, before taking them home. Make sure that your name is accurately spelled, that your address is correct. Check the name and dose of your medication to make sure it is accurate. Check the number of pills, and the written instructions to make sure they are clear and accurate. Make sure that you are given enough medication to last until your next medication refill appointment. 5. Taking Medication: Take medication as prescribed. When it comes to controlled substances, taking less pills or less frequently than prescribed is permitted and encouraged. Never take more pills than instructed. Never take medication more frequently than prescribed.  6. Inform other Doctors: Always inform, all of your healthcare providers, of all the medications you take. 7. Pain Medication from other Providers: You are not allowed to accept any additional pain medication from any other Doctor or Healthcare provider. There are two exceptions to this rule. (see below) In the event that you require additional pain medication, you are responsible for notifying us, as stated below. 8. Medication Agreement: You are responsible for carefully reading and following our Medication Agreement. This must be signed before receiving any prescriptions from our practice. Safely store a copy of your signed Agreement. Violations to the Agreement will result in no further prescriptions. (Additional copies of our Medication Agreement are available upon request.) 9. Laws, Rules, & Regulations: All patients are expected to follow all [address] and [employer], Rules, [place] [employer]. Ignorance of the Laws does not constitute a valid excuse.  10. Illegal  drugs and Controlled Substances: The use of illegal substances (including, but not limited to marijuana and its derivatives) and/or the illegal use of any controlled substances is strictly prohibited. Violation of this rule may result in the immediate and permanent discontinuation of any and  all prescriptions being written by our practice. The use of any illegal substances is prohibited. 11. Adopted CDC guidelines & recommendations: Target dosing levels will be at or below 60 MME/day. Use of benzodiazepines** is not recommended.  Exceptions: There are only two exceptions to the rule of not receiving pain medications from other Healthcare Providers. 1. Exception #1 (Emergencies): In the event of an emergency (i.e.: accident requiring emergency care), you are allowed to receive additional pain medication. However, you are responsible for: As soon as you are able, call our office [phone], at any time of the day or night, and leave a message stating your name, the date and nature of the emergency, and the name and dose of the medication prescribed. In the event that your call is answered by a member of our staff, make sure to document and save the date, time, and the name of the person that took your information.  2. Exception #2 (Planned Surgery): In the event that you are scheduled by another doctor or dentist to have any type of surgery or procedure, you are allowed (for a period no longer than 30 days), to receive additional pain medication, for the acute post-op pain. However, in this case, you are responsible for picking up a copy of our "Post-op Pain Management for Surgeons" handout, and giving it to your surgeon or dentist. This document is available at our office, and does not require an appointment to obtain it. Simply go to our office during business hours (Monday-Thursday from 8:00 AM to 4:00 PM) (Friday 8:00 AM to 12:00 Noon) or if you have a scheduled appointment with us, prior to your surgery,  and ask for it by name. In addition, you will need to provide us with your name, name of your surgeon, type of surgery, and date of procedure or surgery.  *Opioid medications include: morphine, codeine, oxycodone, oxymorphone, hydrocodone, hydromorphone, meperidine, tramadol, tapentadol, buprenorphine, fentanyl, methadone. **Benzodiazepine medications include: diazepam (Valium), alprazolam (Xanax), clonazepam (Klonopine), lorazepam (Ativan), clorazepate (Tranxene), chlordiazepoxide (Librium), estazolam (Prosom), oxazepam (Serax), temazepam (Restoril), triazolam (Halcion) (Last updated: 05/14/2017) ____________________________________________________________________________________________   ____________________________________________________________________________________________  Medication Recommendations and Reminders  Applies to: All patients receiving prescriptions (written and/or electronic).  Medication Rules & Regulations: These rules and regulations exist for your safety and that of others. They are not flexible and neither are we. Dismissing or ignoring them will be considered "non-compliance" with medication therapy, resulting in complete and irreversible termination of such therapy. (See document titled "Medication Rules" for more details.) In all conscience, because of safety reasons, we cannot continue providing a therapy where the patient does not follow instructions.  Pharmacy of record:   Definition: This is the pharmacy where your electronic prescriptions will be sent.   We do not endorse any particular pharmacy.  You are not restricted in your choice of pharmacy.  The pharmacy listed in the electronic medical record should be the one where you want electronic prescriptions to be sent.  If you choose to change pharmacy, simply notify our nursing staff of your choice of new pharmacy.  Recommendations:  Keep all of your pain medications in a safe place, under lock and  key, even if you live alone.   After you fill your prescription, take 1 week's worth of pills and put them away in a safe place. You should keep a separate, properly labeled bottle for this purpose. The remainder should be kept in the original bottle. Use this as your primary supply, until it runs out. Once  it's gone, then you know that you have 1 week's worth of medicine, and it is time to come in for a prescription refill. If you do this correctly, it is unlikely that you will ever run out of medicine.  To make sure that the above recommendation works, it is very important that you make sure your medication refill appointments are scheduled at least 1 week before you run out of medicine. To do this in an effective manner, make sure that you do not leave the office without scheduling your next medication management appointment. Always ask the nursing staff to show you in your prescription , when your medication will be running out. Then arrange for the receptionist to get you a return appointment, at least 7 days before you run out of medicine. Do not wait until you have 1 or 2 pills left, to come in. This is very poor planning and does not take into consideration that we may need to cancel appointments due to bad weather, sickness, or emergencies affecting our staff.  "Partial Fill": If for any reason your pharmacy does not have enough pills/tablets to completely fill or refill your prescription, do not allow for a "partial fill". You will need a separate prescription to fill the remaining amount, which we will not provide. If the reason for the partial fill is your insurance, you will need to talk to the pharmacist about payment alternatives for the remaining tablets, but again, do not accept a partial fill.  Prescription refills and/or changes in medication(s):   Prescription refills, and/or changes in dose or medication, will be conducted only during scheduled medication management appointments.  (Applies to both, written and electronic prescriptions.)  No refills on procedure days. No medication will be changed or started on procedure days. No changes, adjustments, and/or refills will be conducted on a procedure day. Doing so will interfere with the diagnostic portion of the procedure.  No phone refills. No medications will be "called into the pharmacy".  No Fax refills.  No weekend refills.  No Holliday refills.  No after hours refills.  Remember:  Business hours are:  Monday to Thursday 8:00 AM to 4:00 PM Provider's Schedule: Milinda Pointer, MD - Appointments are:  Medication management: Monday and Wednesday 8:00 AM to 4:00 PM Procedure day: Tuesday and Thursday 7:30 AM to 4:00 PM Gillis Santa, MD - Appointments are:  Medication management: Tuesday and Thursday 8:00 AM to 4:00 PM Procedure day: Monday and Wednesday 7:30 AM to 4:00 PM (Last update: 05/14/2017) ____________________________________________________________________________________________

## 2019-05-24 NOTE — Progress Notes (Signed)
Safety precautions to be maintained throughout the outpatient stay will include: orient to surroundings, keep bed in low position, maintain call bell within reach at all times, provide assistance with transfer out of bed and ambulation.  

## 2019-05-24 NOTE — Telephone Encounter (Signed)
Pt has [employer]

## 2019-05-25 NOTE — Telephone Encounter (Signed)
If she is having daily heartburn symptoms that I recommend she start with something like famotidine (Pepcid). If she is just having intermittent heartburn, less than 3 times weekly and Tums are fine.  Is not good to be on long-term omeprazole/Nexium due to chronic acid reduction in his stomach. We can certainly treat with these medications for the short-term if needed.  Let me know.

## 2019-05-28 LAB — COMPLIANCE DRUG ANALYSIS, UR

## 2019-05-29 LAB — 25-HYDROXY VITAMIN D LCMS D2+D3
25-Hydroxy, Vitamin D-2: <1 ng/mL
25-Hydroxy, Vitamin D-3: 54 ng/mL
25-Hydroxy, Vitamin D: 55 ng/mL

## 2019-05-29 LAB — SEDIMENTATION RATE: Sed Rate: 47 mm/hr — ABNORMAL HIGH (ref 0–40)

## 2019-05-29 LAB — MAGNESIUM: Magnesium: 1.3 mg/dL — ABNORMAL LOW (ref 1.6–2.3)

## 2019-05-29 LAB — VITAMIN B12: Vitamin B-12: 377 pg/mL (ref 232–1245)

## 2019-05-29 LAB — C-REACTIVE PROTEIN: CRP: 13 mg/L — ABNORMAL HIGH (ref 0–10)

## 2019-05-31 DIAGNOSIS — K219 Gastro-esophageal reflux disease without esophagitis: Secondary | ICD-10-CM

## 2019-05-31 MED ORDER — OMEPRAZOLE 20 MG PO CPDR: 20.0000 mg | DELAYED_RELEASE_CAPSULE | Freq: Every day | ORAL | 0 refills | Status: DC

## 2019-06-07 ENCOUNTER — Ambulatory Visit (INDEPENDENT_AMBULATORY_CARE_PROVIDER_SITE_OTHER): Payer: Medicare Other | Admitting: Primary Care

## 2019-06-07 ENCOUNTER — Other Ambulatory Visit: Payer: Self-pay

## 2019-06-07 DIAGNOSIS — M5442 Lumbago with sciatica, left side: Secondary | ICD-10-CM

## 2019-06-07 DIAGNOSIS — G8929 Other chronic pain: Secondary | ICD-10-CM | POA: Diagnosis not present

## 2019-06-07 DIAGNOSIS — E1149 Type 2 diabetes mellitus with other diabetic neurological complication: Secondary | ICD-10-CM

## 2019-06-07 DIAGNOSIS — M5441 Lumbago with sciatica, right side: Secondary | ICD-10-CM | POA: Diagnosis not present

## 2019-06-07 DIAGNOSIS — G894 Chronic pain syndrome: Secondary | ICD-10-CM | POA: Diagnosis not present

## 2019-06-07 DIAGNOSIS — R413 Other amnesia: Secondary | ICD-10-CM | POA: Diagnosis not present

## 2019-06-07 DIAGNOSIS — E1142 Type 2 diabetes mellitus with diabetic polyneuropathy: Secondary | ICD-10-CM

## 2019-06-07 NOTE — Patient Instructions (Signed)
You will be contacted regarding your referral to neurology.  Please let us know if you have not been contacted within two weeks.   Follow up with pain management as discussed.  It was a pleasure to see you today! Mayra Reel, NP-C

## 2019-06-07 NOTE — Assessment & Plan Note (Addendum)
Chronic for the last one year, also with strange behavior by giving away personal information such as social security cards, etc.   This could be multifactorial given her history of CAD and cerebral vascular disease, could also be an issue of polypharmacy as she is on numerous sedating medications. Vascular dementia could be present, early perhaps.  Strongly advised we work towards getting her off of her medications as there are surely interactions that could be contributing. Her daughter will check on her medication bottles once she gets home.  Referral placed to neurology for evaluation. Consider psychiatry referral if needed.

## 2019-06-07 NOTE — Assessment & Plan Note (Signed)
Discussed that I will not refill narcotics and that the goal is to completely wean off. Her daughter agrees.  Follow up with pain management as referred. She will complete required imaging tomorrow.  

## 2019-06-07 NOTE — Progress Notes (Signed)
Subjective:    Patient ID: Michelle Kim, female    DOB: 07-12-1950, 69 y.o.   MRN: [id number]  HPI  Virtual Visit via Video Note  I connected with [name] on 06/07/19 at 10:20 AM EDT by a video enabled telemedicine application and verified that I am speaking with the correct person using two identifiers.  Location: Patient: Home Provider: Office   I discussed the limitations of evaluation and management by telemedicine and the availability of in person appointments. The patient expressed understanding and agreed to proceed.  History of Present Illness:  [name] is a 69 year old female with a history of CAD, cerebral ischemia, diabetic neuropathy, type 2 diabetes, chronic back pain, DDD cervical spine, chronic knee pain who presents today with her daughter to discuss memory issues and odd behavior.  Her daughter sent a My Chart message on 06/02/19 with concerns about her mothers depression, difficulty with remembering conversations.   Today the patient is with her daughter who reports that her mother is having difficulty remembering short term things, her daughter with have to repeat things 3-4 times. History of multiple falls when living in [place], hit her head a few times, did not seek treatment. She can remember long term things, just not day to day things. Symptoms began about one year ago.   Her daughter also reports strange behavior. She's been communicating with strange men through APPs on her phone. The men are requesting money from her, she's also provided her social security number and ID information to someone who claimed to be in [place] trying to get home to the [place], she took a picture of her breast and sent it to a man who then threatened to put it online if she didn't pay him $1000. This all started about one year ago.   She is on numerous medications including Percocet, duloxetine, tizanidine, carbamazepine. Currently working on establishing care with pain  management.   She is also needing handicap placard as she cannot walking long distances due to arthritis.    Observations/Objective:  Alert, does recall the strange behavior and interactions with men.  Appears well, not sickly. No distress. Speaking in complete sentences.   Assessment and Plan:  See problem based charting.   Follow Up Instructions:  You will be contacted regarding your referral to neurology.  Please let us know if you have not been contacted within two weeks.   Follow up with pain management as discussed.  It was a pleasure to see you today! [name], NP-C    I discussed the assessment and treatment plan with the patient. The patient was provided an opportunity to ask questions and all were answered. The patient agreed with the plan and demonstrated an understanding of the instructions.   The patient was advised to call back or seek an in-person evaluation if the symptoms worsen or if the condition fails to improve as anticipated.    [name], NP    Review of Systems  Respiratory: Negative for shortness of breath.   Cardiovascular: Negative for chest pain.  Musculoskeletal: Positive for arthralgias and back pain.  Neurological: Negative for dizziness and speech difficulty.       Poor short term memory, abnormal behavior       Past Medical History:  Diagnosis Date  . CAD (coronary artery disease)   . Chronic back pain   . Diabetic neuropathy ([place])   . GERD (gastroesophageal reflux disease)   . Hyperlipidemia   .  Type 2 diabetes mellitus ([place])      Social History   Socioeconomic History  . Marital status: Widowed    Spouse name: Not on file  . Number of children: Not on file  . Years of education: Not on file  . Highest education level: Not on file  Occupational History  . Not on file  Tobacco Use  . Smoking status: Former Research scientist (life sciences)  . Smokeless tobacco: Never Used  Substance and Sexual Activity  . Alcohol use: Never  . Drug  use: Never  . Sexual activity: Not on file  Other Topics Concern  . Not on file  Social History Narrative  . Not on file   Social Determinants of Health   Financial Resource Strain:   . Difficulty of Paying Living Expenses:   Food Insecurity:   . Worried About Charity fundraiser in the Last Year:   . Arboriculturist in the Last Year:   Transportation Needs:   . Film/video editor (Medical):   Marland Kitchen Lack of Transportation (Non-Medical):   Physical Activity:   . Days of Exercise per Week:   . Minutes of Exercise per Session:   Stress:   . Feeling of Stress :   Social Connections:   . Frequency of Communication with Friends and Family:   . Frequency of Social Gatherings with Friends and Family:   . Attends Religious Services:   . Active Member of Clubs or Organizations:   . Attends Archivist Meetings:   Marland Kitchen Marital Status:   Intimate Partner Violence:   . Fear of Current or Ex-Partner:   . Emotionally Abused:   Marland Kitchen Physically Abused:   . Sexually Abused:     Past Surgical History:  Procedure Laterality Date  . CHOLECYSTECTOMY  1993    Family History  Problem Relation Age of Onset  . Early death Mother   . Heart attack Father   . Cancer Father   . Cancer Sister   . Diabetes Sister   . Depression Sister   . Cancer Sister   . Diabetes Sister   . Kidney disease Sister   . COPD Sister   . Diabetes Sister   . COPD Sister   . Diabetes Sister   . Depression Sister     Allergies  Allergen Reactions  . Aspirin   . Codeine   . Lactose Intolerance (Gi) Hives  . Latex     Current Outpatient Medications on File Prior to Visit  Medication Sig Dispense Refill  . Blood Glucose Monitoring Suppl (ONETOUCH VERIO) w/Device KIT by Does not apply route.    . calcipotriene (DOVONOX) 0.005 % ointment APPLY AND GENTLY MASSAGE INTO AFFECTED AREA TWICE DAILY    . carbamazepine (CARBATROL) 300 MG 12 hr capsule Take 300 mg by mouth daily.    . carvedilol (COREG) 6.25 MG  tablet Take 6.25 mg by mouth 2 (two) times daily with a meal.    . Cholecalciferol (VITAMIN D3) 50 MCG (2000 UT) TABS Take 2,000 Units by mouth daily.    . Dulaglutide (TRULICITY) 1.5 XT/0.6YI SOPN Inject 1.5 mg into the skin once a week. For diabetes. 2 mL 3  . DULoxetine (CYMBALTA) 60 MG capsule Take 120 mg by mouth 2 (two) times daily.     Marland Kitchen ezetimibe (ZETIA) 10 MG tablet Take 10 mg by mouth daily.    Marland Kitchen glimepiride (AMARYL) 4 MG tablet Take 1 tablet (4 mg total) by mouth daily with breakfast. For  diabetes. 90 tablet 3  . insulin lispro (HUMALOG) 100 UNIT/ML injection Inject 30 Units into the skin 3 (three) times daily before meals.     . Insulin Pen Needle (RELION MINI PEN NEEDLES) 31G X 6 MM MISC by Does not apply route. Use 3 pen needles daily    . Lancet Devices (ONETOUCH DELICA PLUS LANCING) MISC by Does not apply route. USE AS DIRECTED 4 TIMES DAILY    . lidocaine (LIDODERM) 5 % APPLY 1 PATCH TO THE AFFECTED AREA AND LEAVE IN PLACE FOR 12 HOURS,  THEN REMOVE AND LEAVE OFF FOR 12 HOURS    . meloxicam (MOBIC) 7.5 MG tablet Take 7.5 mg by mouth 2 (two) times daily as needed for pain.    Marland Kitchen omeprazole (PRILOSEC) 20 MG capsule Take 1 capsule (20 mg total) by mouth daily. For heartburn. 90 capsule 0  . Ospemifene (OSPHENA) 60 MG TABS Take 60 mg by mouth daily.    Marland Kitchen oxyCODONE-acetaminophen (PERCOCET) 10-325 MG tablet Take 1 tablet by mouth 3 (three) times daily as needed. For severe pain. 42 tablet 0  . pioglitazone (ACTOS) 30 MG tablet Take 1 tablet (30 mg total) by mouth daily. For diabetes. 90 tablet 3  . rosuvastatin (CRESTOR) 10 MG tablet Take 10 mg by mouth daily.    Marland Kitchen tiZANidine (ZANAFLEX) 4 MG tablet Take 4 mg by mouth every 6 (six) hours as needed.     No current facility-administered medications on file prior to visit.    There were no vitals taken for this visit.   Objective:   Physical Exam  Constitutional: She is oriented to person, place, and time. She appears well-nourished.    Respiratory: Effort normal.  Neurological: She is alert and oriented to person, place, and time.  Psychiatric: She has a normal mood and affect.           Assessment & Plan:

## 2019-06-07 NOTE — Assessment & Plan Note (Signed)
Discussed that I will not refill narcotics and that the goal is to completely wean off. Her daughter agrees.  Follow up with pain management as referred. She will complete required imaging tomorrow.

## 2019-06-08 ENCOUNTER — Ambulatory Visit
Admission: RE | Admit: 2019-06-08 | Discharge: 2019-06-08 | Disposition: A | Discharge status: Home / Self Care | Payer: Medicare Other | Source: Ambulatory Visit | Attending: Pain Medicine | Admitting: Pain Medicine

## 2019-06-08 ENCOUNTER — Other Ambulatory Visit: Payer: Self-pay

## 2019-06-08 DIAGNOSIS — M25552 Pain in left hip: Secondary | ICD-10-CM | POA: Insufficient documentation

## 2019-06-08 DIAGNOSIS — M25562 Pain in left knee: Secondary | ICD-10-CM | POA: Insufficient documentation

## 2019-06-08 DIAGNOSIS — M549 Dorsalgia, unspecified: Secondary | ICD-10-CM

## 2019-06-08 DIAGNOSIS — M25511 Pain in right shoulder: Secondary | ICD-10-CM | POA: Diagnosis not present

## 2019-06-08 DIAGNOSIS — M5441 Lumbago with sciatica, right side: Secondary | ICD-10-CM | POA: Diagnosis not present

## 2019-06-08 DIAGNOSIS — M25512 Pain in left shoulder: Secondary | ICD-10-CM | POA: Diagnosis not present

## 2019-06-08 DIAGNOSIS — M25551 Pain in right hip: Secondary | ICD-10-CM | POA: Diagnosis not present

## 2019-06-08 DIAGNOSIS — M542 Cervicalgia: Secondary | ICD-10-CM | POA: Insufficient documentation

## 2019-06-08 DIAGNOSIS — M47814 Spondylosis without myelopathy or radiculopathy, thoracic region: Secondary | ICD-10-CM | POA: Diagnosis not present

## 2019-06-08 DIAGNOSIS — M25561 Pain in right knee: Secondary | ICD-10-CM | POA: Insufficient documentation

## 2019-06-08 DIAGNOSIS — M19012 Primary osteoarthritis, left shoulder: Secondary | ICD-10-CM | POA: Diagnosis not present

## 2019-06-08 DIAGNOSIS — G8929 Other chronic pain: Secondary | ICD-10-CM | POA: Insufficient documentation

## 2019-06-08 DIAGNOSIS — M47812 Spondylosis without myelopathy or radiculopathy, cervical region: Secondary | ICD-10-CM | POA: Diagnosis not present

## 2019-06-08 DIAGNOSIS — M5442 Lumbago with sciatica, left side: Secondary | ICD-10-CM | POA: Insufficient documentation

## 2019-06-08 DIAGNOSIS — M5136 Other intervertebral disc degeneration, lumbar region: Secondary | ICD-10-CM | POA: Diagnosis not present

## 2019-06-08 MED ORDER — CARBAMAZEPINE ER 200 MG PO CP12: 200.0000 mg | ORAL_CAPSULE | Freq: Two times a day (BID) | ORAL | 0 refills | Status: DC

## 2019-07-16 DIAGNOSIS — E785 Hyperlipidemia, unspecified: Secondary | ICD-10-CM

## 2019-07-16 NOTE — Telephone Encounter (Signed)
Medication pulled down for review. 

## 2019-07-18 MED ORDER — OMEGA-3-ACID ETHYL ESTERS 1 G PO CAPS: 2.0000 | ORAL_CAPSULE | Freq: Two times a day (BID) | ORAL | 1 refills | Status: DC

## 2019-08-01 DIAGNOSIS — E785 Hyperlipidemia, unspecified: Secondary | ICD-10-CM

## 2019-08-02 MED ORDER — ROSUVASTATIN CALCIUM 10 MG PO TABS: 10.0000 mg | ORAL_TABLET | Freq: Every evening | ORAL | 2 refills | Status: AC

## 2019-08-02 NOTE — Telephone Encounter (Signed)
Have not prescribed yet. Last OV on 06/07/2019 . No future OV scheduled.

## 2019-08-07 DIAGNOSIS — G894 Chronic pain syndrome: Secondary | ICD-10-CM

## 2019-08-07 DIAGNOSIS — I2510 Atherosclerotic heart disease of native coronary artery without angina pectoris: Secondary | ICD-10-CM

## 2019-08-07 DIAGNOSIS — G8929 Other chronic pain: Secondary | ICD-10-CM

## 2019-08-07 DIAGNOSIS — M5442 Lumbago with sciatica, left side: Secondary | ICD-10-CM

## 2019-08-08 MED ORDER — CARVEDILOL 6.25 MG PO TABS: 6.2500 mg | ORAL_TABLET | Freq: Two times a day (BID) | ORAL | 3 refills | Status: AC

## 2019-08-08 MED ORDER — MELOXICAM 7.5 MG PO TABS: 7.5000 mg | ORAL_TABLET | Freq: Two times a day (BID) | ORAL | 0 refills | Status: AC | PRN

## 2019-08-08 NOTE — Telephone Encounter (Signed)
Both Rx have not prescribed. Last OV on 06/07/2019. No future OV scheduled

## 2019-08-21 ENCOUNTER — Other Ambulatory Visit: Payer: Self-pay

## 2019-08-21 DIAGNOSIS — K219 Gastro-esophageal reflux disease without esophagitis: Secondary | ICD-10-CM

## 2019-08-22 MED ORDER — OMEPRAZOLE 20 MG PO CPDR: 20.0000 mg | DELAYED_RELEASE_CAPSULE | Freq: Every day | ORAL | 0 refills | Status: DC

## 2019-08-25 NOTE — Telephone Encounter (Signed)
Have not prescribe. Last OV on 06/07/2019. No future OV scheduled

## 2019-08-26 MED ORDER — DULOXETINE HCL 60 MG PO CPEP: 60.0000 mg | ORAL_CAPSULE | Freq: Two times a day (BID) | ORAL | 0 refills | Status: DC

## 2019-09-22 ENCOUNTER — Other Ambulatory Visit: Payer: Self-pay | Admitting: Primary Care

## 2019-09-23 NOTE — Telephone Encounter (Signed)
Refills sent to pharmacy. 

## 2019-09-23 NOTE — Telephone Encounter (Signed)
Last prescribed on 08/26/2019 Last OV (follow up ) with Mayra Reel on 06/07/2019  No future OV scheduled

## 2019-09-29 ENCOUNTER — Other Ambulatory Visit: Payer: Self-pay

## 2019-09-29 DIAGNOSIS — E1149 Type 2 diabetes mellitus with other diabetic neurological complication: Secondary | ICD-10-CM

## 2019-09-29 DIAGNOSIS — E1142 Type 2 diabetes mellitus with diabetic polyneuropathy: Secondary | ICD-10-CM

## 2019-09-29 NOTE — Telephone Encounter (Signed)
Please notify patient's daughter that we need to continue to wean her down on this medication. This medication, combined with a lot of her other medications can cause confusion, falls, etc.  I recommend we reduce to 100 mg twice daily.

## 2019-09-29 NOTE — Telephone Encounter (Signed)
Last prescribed on 06/08/2019 Last OV (follow up) with Mayra Reel on 06/07/2019  No future OV scheduled

## 2019-09-30 NOTE — Telephone Encounter (Signed)
Spoken to patient's daughter. She stated please go ahead and make the change. Patient is in [place] and taking care of sister who is very ill. Patient just lost her other sister just a recently as well. Patient's daughter just wanted you to know.

## 2019-10-02 NOTE — Telephone Encounter (Signed)
Did she run out of the carbamazepine? Based on our last fill date of March 2021, we ordered one month supply. I'm sorry, but if she's been off of this medication then we should not resume. Please clarify. Also please tell Darl Pikes that we are so sorry to hear about everything that's going on. We are thinking about her.

## 2019-10-04 ENCOUNTER — Other Ambulatory Visit: Payer: Self-pay | Admitting: Primary Care

## 2019-10-04 DIAGNOSIS — E1149 Type 2 diabetes mellitus with other diabetic neurological complication: Secondary | ICD-10-CM

## 2019-10-04 DIAGNOSIS — E1142 Type 2 diabetes mellitus with diabetic polyneuropathy: Secondary | ICD-10-CM

## 2019-10-05 DIAGNOSIS — E1142 Type 2 diabetes mellitus with diabetic polyneuropathy: Secondary | ICD-10-CM

## 2019-10-06 MED ORDER — CARBAMAZEPINE ER 100 MG PO CP12: 100.0000 mg | ORAL_CAPSULE | Freq: Two times a day (BID) | ORAL | 0 refills | Status: DC

## 2019-10-26 ENCOUNTER — Other Ambulatory Visit: Payer: Self-pay

## 2019-10-26 ENCOUNTER — Other Ambulatory Visit: Payer: Self-pay | Admitting: Primary Care

## 2019-10-26 MED ORDER — DULOXETINE HCL 60 MG PO CPEP: 60.0000 mg | ORAL_CAPSULE | Freq: Two times a day (BID) | ORAL | 0 refills | Status: DC

## 2019-11-05 ENCOUNTER — Other Ambulatory Visit: Payer: Self-pay | Admitting: Primary Care

## 2019-11-05 DIAGNOSIS — E1142 Type 2 diabetes mellitus with diabetic polyneuropathy: Secondary | ICD-10-CM

## 2019-11-08 ENCOUNTER — Other Ambulatory Visit: Payer: Self-pay

## 2019-11-08 ENCOUNTER — Telehealth (INDEPENDENT_AMBULATORY_CARE_PROVIDER_SITE_OTHER): Payer: Medicare Other | Admitting: Primary Care

## 2019-11-08 DIAGNOSIS — E785 Hyperlipidemia, unspecified: Secondary | ICD-10-CM

## 2019-11-08 DIAGNOSIS — M5442 Lumbago with sciatica, left side: Secondary | ICD-10-CM

## 2019-11-08 DIAGNOSIS — G894 Chronic pain syndrome: Secondary | ICD-10-CM

## 2019-11-08 DIAGNOSIS — M5441 Lumbago with sciatica, right side: Secondary | ICD-10-CM

## 2019-11-08 DIAGNOSIS — G8929 Other chronic pain: Secondary | ICD-10-CM

## 2019-11-08 DIAGNOSIS — Z794 Long term (current) use of insulin: Secondary | ICD-10-CM

## 2019-11-08 DIAGNOSIS — I2510 Atherosclerotic heart disease of native coronary artery without angina pectoris: Secondary | ICD-10-CM | POA: Diagnosis not present

## 2019-11-08 DIAGNOSIS — E1149 Type 2 diabetes mellitus with other diabetic neurological complication: Secondary | ICD-10-CM

## 2019-11-08 DIAGNOSIS — E1169 Type 2 diabetes mellitus with other specified complication: Secondary | ICD-10-CM

## 2019-11-08 MED ORDER — TRULICITY 1.5 MG/0.5ML ~~LOC~~ SOAJ: 1.5000 mg | SUBCUTANEOUS | 3 refills | Status: DC

## 2019-11-08 NOTE — Assessment & Plan Note (Signed)
Appears to be uncontrolled based off glucose readings. She currently resides in [place], so we will have her complete labs at a local [facility] station in order to update A1c.  Continue glimepiride, Humalog 18 units TID with meals, Trulicity weekly.  We discussed that ultimately she will need to find a local PCP to managed treatment. She understands.

## 2019-11-08 NOTE — Progress Notes (Signed)
Subjective:    Patient ID: Michelle Kim, female    DOB: 04/25/1950, 69 y.o.   MRN: [id number]  HPI  Virtual Visit via Video Note  I connected with Michelle Kim on 11/08/19 at  2:40 PM EDT by a video enabled telemedicine application and verified that I am speaking with the correct person using two identifiers.  Location: Patient: home Provider: office Participants: patient and myself   I discussed the limitations of evaluation and management by telemedicine and the availability of in person appointments. The patient expressed understanding and agreed to proceed.  History of Present Illness:  Michelle Kim is a 68 year old female with an extensive medical history including CAD, GERD, cerebral ischemia, diabetic neuropathy, type 2 diabetes, chronic low back pain, chronic pain syndrome who presents today for follow up of chronic conditions.   She is currently residing in [place] with her sister, relocated from [place] and doesn't believe that she'll be returning soon.  1) CAD/Hyperlipidemia: Currently managed on rosuvastatin 10 mg, carvedilol 6.25 mg BID. She is checking her blood pressure at home infrequently which is running 120/70. Compliant to all medications.   She denies chest pain, dizziness, shortness of breath.   2) Type 2 Diabetes/Diabetic Neuropathy: Currently managed on Actos 30 mg, glimeipride 4 mg, Trulicity 1.5 mg, Humalog 30 units TID. Her last A1C was 6.7 in February 2021. She is actually injecting Humalog 18 units three times daily as her "sugar was dropping with 30 units".   She is checking her blood sugars twice daily and is getting readings of:  AM fasting: high 100's Before Dinner: mid 100's  Also managed on carbamazepine for which she's been taking for years from her prior PCP for neuropathy. We reduced her dose down to 100 mg BID last month due to symptoms of confusion that were reported by her daughter.   3) Chronic Pain Syndrome: Currently managed  on duloxetine 60 mg BID, carbamazepine 100 mg BID, Meloxicam 7.5 mg BID. Previously managed on Percocet, followed with pain management once, has not been prescribed Percocet by [place] since February 2021.   Observations/Objective:  Alert and oriented. Appears well, not sickly. No distress. Speaking in complete sentences.  Assessment and Plan:  See problem based charting.  Follow Up Instructions:  Please find a [facility] to complete lab work.  You need to find a local PCP in [place] if you plan on staying there permanently.  Continue to monitor your blood sugars and blood pressure.  It was a pleasure to see you today! [name], NP-C     I discussed the assessment and treatment plan with the patient. The patient was provided an opportunity to ask questions and all were answered. The patient agreed with the plan and demonstrated an understanding of the instructions.   The patient was advised to call back or seek an in-person evaluation if the symptoms worsen or if the condition fails to improve as anticipated.    [name], NP    Review of Systems  Eyes: Negative for visual disturbance.  Respiratory: Negative for shortness of breath.   Cardiovascular: Negative for chest pain.  Musculoskeletal: Positive for arthralgias.  Neurological: Negative for dizziness and headaches.       Past Medical History:  Diagnosis Date  . CAD (coronary artery disease)   . Chronic back pain   . Diabetic neuropathy ([place])   . GERD (gastroesophageal reflux disease)   . Hyperlipidemia   . Type 2 diabetes mellitus ([place])  Social History   Socioeconomic History  . Marital status: Widowed    Spouse name: Not on file  . Number of children: Not on file  . Years of education: Not on file  . Highest education level: Not on file  Occupational History  . Not on file  Tobacco Use  . Smoking status: Former Research scientist (life sciences)  . Smokeless tobacco: Never Used  Substance and Sexual Activity    . Alcohol use: Never  . Drug use: Never  . Sexual activity: Not on file  Other Topics Concern  . Not on file  Social History Narrative  . Not on file   Social Determinants of Health   Financial Resource Strain:   . Difficulty of Paying Living Expenses: Not on file  Food Insecurity:   . Worried About Charity fundraiser in the Last Year: Not on file  . Ran Out of Food in the Last Year: Not on file  Transportation Needs:   . Lack of Transportation (Medical): Not on file  . Lack of Transportation (Non-Medical): Not on file  Physical Activity:   . Days of Exercise per Week: Not on file  . Minutes of Exercise per Session: Not on file  Stress:   . Feeling of Stress : Not on file  Social Connections:   . Frequency of Communication with Friends and Family: Not on file  . Frequency of Social Gatherings with Friends and Family: Not on file  . Attends Religious Services: Not on file  . Active Member of Clubs or Organizations: Not on file  . Attends Archivist Meetings: Not on file  . Marital Status: Not on file  Intimate Partner Violence:   . Fear of Current or Ex-Partner: Not on file  . Emotionally Abused: Not on file  . Physically Abused: Not on file  . Sexually Abused: Not on file    Past Surgical History:  Procedure Laterality Date  . CHOLECYSTECTOMY  1993    Family History  Problem Relation Age of Onset  . Early death Mother   . Heart attack Father   . Cancer Father   . Cancer Sister   . Diabetes Sister   . Depression Sister   . Cancer Sister   . Diabetes Sister   . Kidney disease Sister   . COPD Sister   . Diabetes Sister   . COPD Sister   . Diabetes Sister   . Depression Sister     Allergies  Allergen Reactions  . Aspirin   . Codeine   . Lactose Intolerance (Gi) Hives  . Latex     Current Outpatient Medications on File Prior to Visit  Medication Sig Dispense Refill  . Blood Glucose Monitoring Suppl (ONETOUCH VERIO) w/Device KIT by Does not  apply route.    . calcipotriene (DOVONOX) 0.005 % ointment APPLY AND GENTLY MASSAGE INTO AFFECTED AREA TWICE DAILY    . carbamazepine (CARBATROL) 100 MG 12 hr capsule Take 1 capsule (100 mg total) by mouth 2 (two) times daily. 60 capsule 0  . carvedilol (COREG) 6.25 MG tablet Take 1 tablet (6.25 mg total) by mouth 2 (two) times daily with a meal. For blood pressure and heart. 180 tablet 3  . Cholecalciferol (VITAMIN D3) 50 MCG (2000 UT) TABS Take 2,000 Units by mouth daily.    . DULoxetine (CYMBALTA) 60 MG capsule Take 1 capsule (60 mg total) by mouth 2 (two) times daily. 60 capsule 0  . glimepiride (AMARYL) 4 MG tablet Take 1  tablet (4 mg total) by mouth daily with breakfast. For diabetes. 90 tablet 3  . insulin lispro (HUMALOG) 100 UNIT/ML injection Inject 30 Units into the skin 3 (three) times daily before meals.     . Insulin Pen Needle (RELION MINI PEN NEEDLES) 31G X 6 MM MISC by Does not apply route. Use 3 pen needles daily    . Lancet Devices (ONETOUCH DELICA PLUS LANCING) MISC by Does not apply route. USE AS DIRECTED 4 TIMES DAILY    . meloxicam (MOBIC) 7.5 MG tablet Take 1 tablet (7.5 mg total) by mouth 2 (two) times daily as needed for pain. 180 tablet 0  . omega-3 acid ethyl esters (LOVAZA) 1 g capsule Take 2 capsules (2 g total) by mouth 2 (two) times daily. For triglycerides. 360 capsule 1  . omeprazole (PRILOSEC) 20 MG capsule Take 1 capsule (20 mg total) by mouth daily. For heartburn. 90 capsule 0  . pioglitazone (ACTOS) 30 MG tablet Take 1 tablet (30 mg total) by mouth daily. For diabetes. 90 tablet 3  . rosuvastatin (CRESTOR) 10 MG tablet Take 1 tablet (10 mg total) by mouth every evening. For cholesterol. 90 tablet 2   No current facility-administered medications on file prior to visit.    There were no vitals taken for this visit.   Objective:   Physical Exam Pulmonary:     Effort: Pulmonary effort is normal.  Neurological:     Mental Status: She is alert and oriented to  person, place, and time.  Psychiatric:        Mood and Affect: Mood normal.        Behavior: Behavior normal.            Assessment & Plan:

## 2019-11-08 NOTE — Telephone Encounter (Signed)
Refills sent to pharmacy. 

## 2019-11-08 NOTE — Assessment & Plan Note (Signed)
Asymptomatic. Compliant to statin therapy and beta-blocker. Repeat lipids pending.

## 2019-11-08 NOTE — Assessment & Plan Note (Signed)
Compliant to statin therapy, repeat lipids pending. She will have labs drawn at [facility] locally.

## 2019-11-08 NOTE — Assessment & Plan Note (Signed)
No longer on narcotics or tizanidine. Continue Duloxetine 60 mg BID and Meloxicam 7.5 mg BID.  

## 2019-11-08 NOTE — Assessment & Plan Note (Signed)
No longer on narcotics or tizanidine. Continue Duloxetine 60 mg BID and Meloxicam 7.5 mg BID.

## 2019-11-08 NOTE — Assessment & Plan Note (Signed)
Weaning down on carbamazepine to 100 mg given such high doses before. Overall stable. Continue to monitor.

## 2019-12-09 ENCOUNTER — Other Ambulatory Visit: Payer: Self-pay | Admitting: Primary Care

## 2019-12-09 DIAGNOSIS — K219 Gastro-esophageal reflux disease without esophagitis: Secondary | ICD-10-CM

## 2019-12-31 ENCOUNTER — Other Ambulatory Visit: Payer: Self-pay | Admitting: Primary Care

## 2020-01-20 ENCOUNTER — Other Ambulatory Visit: Payer: Self-pay | Admitting: Primary Care

## 2020-01-20 DIAGNOSIS — E785 Hyperlipidemia, unspecified: Secondary | ICD-10-CM

## 2020-03-16 ENCOUNTER — Other Ambulatory Visit: Payer: Self-pay | Admitting: Primary Care

## 2020-03-16 DIAGNOSIS — Z794 Long term (current) use of insulin: Secondary | ICD-10-CM

## 2020-03-16 DIAGNOSIS — E1169 Type 2 diabetes mellitus with other specified complication: Secondary | ICD-10-CM

## 2020-04-10 ENCOUNTER — Other Ambulatory Visit: Payer: Self-pay | Admitting: Primary Care

## 2020-04-10 DIAGNOSIS — E785 Hyperlipidemia, unspecified: Secondary | ICD-10-CM

## 2020-04-10 NOTE — Telephone Encounter (Signed)
This patient now lives in [place].  I told her during our visit in August 2021 that she needs to get some labs drawn [facility] a [facility] locally and I do not have those results.  I also told her that she needs to find a PCP in [place] and that I cannot continue to refill her medications as she doesn't live locally any longer.  What is her status with those requests? Again, this was requested in August 2021.

## 2020-04-11 NOTE — Telephone Encounter (Signed)
Called and LVM for patient to return call to office.  

## 2020-04-12 NOTE — Telephone Encounter (Signed)
My chart message sent to patient. Will continue to try to call her.

## 2020-04-13 NOTE — Telephone Encounter (Signed)
Called home number disconnected called cell left message to call office.

## 2020-04-13 NOTE — Telephone Encounter (Signed)
Not able to contact patient would you like me to reach out to emergency contact who is on her DPR?

## 2020-04-14 ENCOUNTER — Other Ambulatory Visit: Payer: Self-pay | Admitting: Primary Care

## 2020-04-14 DIAGNOSIS — Z794 Long term (current) use of insulin: Secondary | ICD-10-CM

## 2020-04-14 DIAGNOSIS — E1169 Type 2 diabetes mellitus with other specified complication: Secondary | ICD-10-CM

## 2020-04-16 NOTE — Telephone Encounter (Signed)
Noted, will refuse meds since we cannot get a hold of her and since she is now living in [place] full time.

## 2020-04-16 NOTE — Telephone Encounter (Signed)
Last seen in office 3/23 has had virtual ok to refill #30 and have make f/u

## 2020-04-16 NOTE — Telephone Encounter (Signed)
Michelle Kim, this is a patient we cannot get a hold of, she moved to [place] nearly 1 year ago, I told her to establish with a local PCP in [place].  You can try to get a hold of her again, but I cannot continue to refill her medications given the distance

## 2020-05-02 ENCOUNTER — Other Ambulatory Visit: Payer: Self-pay | Admitting: Primary Care

## 2020-05-02 DIAGNOSIS — E1169 Type 2 diabetes mellitus with other specified complication: Secondary | ICD-10-CM

## 2020-05-02 DIAGNOSIS — E785 Hyperlipidemia, unspecified: Secondary | ICD-10-CM

## 2020-05-03 NOTE — Telephone Encounter (Signed)
Patient moved to [place] over 6 months ago, is not responding to our calls.  Will deny refill request.

## 2020-08-10 ENCOUNTER — Other Ambulatory Visit: Payer: Self-pay | Admitting: Primary Care

## 2020-08-10 DIAGNOSIS — Z794 Long term (current) use of insulin: Secondary | ICD-10-CM

## 2020-08-10 DIAGNOSIS — E1169 Type 2 diabetes mellitus with other specified complication: Secondary | ICD-10-CM

## 2020-11-23 ENCOUNTER — Other Ambulatory Visit: Payer: Self-pay | Admitting: Primary Care

## 2020-11-23 DIAGNOSIS — Z794 Long term (current) use of insulin: Secondary | ICD-10-CM

## 2020-11-23 DIAGNOSIS — E1169 Type 2 diabetes mellitus with other specified complication: Secondary | ICD-10-CM

## 2021-03-22 DIAGNOSIS — E119 Type 2 diabetes mellitus without complications: Secondary | ICD-10-CM | POA: Diagnosis not present

## 2021-03-22 DIAGNOSIS — L97518 Non-pressure chronic ulcer of other part of right foot with other specified severity: Secondary | ICD-10-CM | POA: Diagnosis not present

## 2021-03-22 DIAGNOSIS — E11628 Type 2 diabetes mellitus with other skin complications: Secondary | ICD-10-CM | POA: Diagnosis not present

## 2021-03-22 DIAGNOSIS — L089 Local infection of the skin and subcutaneous tissue, unspecified: Secondary | ICD-10-CM | POA: Diagnosis not present

## 2021-03-22 DIAGNOSIS — M7989 Other specified soft tissue disorders: Secondary | ICD-10-CM | POA: Diagnosis not present

## 2021-03-22 DIAGNOSIS — M19071 Primary osteoarthritis, right ankle and foot: Secondary | ICD-10-CM | POA: Diagnosis not present

## 2021-05-06 DIAGNOSIS — I2510 Atherosclerotic heart disease of native coronary artery without angina pectoris: Secondary | ICD-10-CM | POA: Diagnosis not present

## 2021-05-06 DIAGNOSIS — Z79899 Other long term (current) drug therapy: Secondary | ICD-10-CM | POA: Diagnosis not present

## 2021-05-06 DIAGNOSIS — Z885 Allergy status to narcotic agent status: Secondary | ICD-10-CM | POA: Diagnosis not present

## 2021-05-06 DIAGNOSIS — Z87891 Personal history of nicotine dependence: Secondary | ICD-10-CM | POA: Diagnosis not present

## 2021-05-06 DIAGNOSIS — R739 Hyperglycemia, unspecified: Secondary | ICD-10-CM | POA: Diagnosis not present

## 2021-05-06 DIAGNOSIS — Z7984 Long term (current) use of oral hypoglycemic drugs: Secondary | ICD-10-CM | POA: Diagnosis not present

## 2021-05-06 DIAGNOSIS — L089 Local infection of the skin and subcutaneous tissue, unspecified: Secondary | ICD-10-CM | POA: Diagnosis not present

## 2021-05-06 DIAGNOSIS — R7982 Elevated C-reactive protein (CRP): Secondary | ICD-10-CM | POA: Diagnosis not present

## 2021-05-06 DIAGNOSIS — M87079 Idiopathic aseptic necrosis of unspecified toe(s): Secondary | ICD-10-CM | POA: Diagnosis not present

## 2021-05-06 DIAGNOSIS — I739 Peripheral vascular disease, unspecified: Secondary | ICD-10-CM | POA: Diagnosis not present

## 2021-05-06 DIAGNOSIS — I10 Essential (primary) hypertension: Secondary | ICD-10-CM | POA: Diagnosis not present

## 2021-05-06 DIAGNOSIS — E119 Type 2 diabetes mellitus without complications: Secondary | ICD-10-CM | POA: Diagnosis not present

## 2021-05-06 DIAGNOSIS — R9431 Abnormal electrocardiogram [ECG] [EKG]: Secondary | ICD-10-CM | POA: Diagnosis not present

## 2021-05-06 DIAGNOSIS — L97509 Non-pressure chronic ulcer of other part of unspecified foot with unspecified severity: Secondary | ICD-10-CM | POA: Diagnosis not present

## 2021-05-06 DIAGNOSIS — S90851A Superficial foreign body, right foot, initial encounter: Secondary | ICD-10-CM | POA: Diagnosis not present

## 2021-05-06 DIAGNOSIS — E1151 Type 2 diabetes mellitus with diabetic peripheral angiopathy without gangrene: Secondary | ICD-10-CM | POA: Diagnosis not present

## 2021-05-06 DIAGNOSIS — M79674 Pain in right toe(s): Secondary | ICD-10-CM | POA: Diagnosis not present

## 2021-05-06 DIAGNOSIS — M86172 Other acute osteomyelitis, left ankle and foot: Secondary | ICD-10-CM | POA: Diagnosis not present

## 2021-05-06 DIAGNOSIS — R700 Elevated erythrocyte sedimentation rate: Secondary | ICD-10-CM | POA: Diagnosis not present

## 2021-05-06 DIAGNOSIS — R531 Weakness: Secondary | ICD-10-CM | POA: Diagnosis not present

## 2021-05-06 DIAGNOSIS — E11628 Type 2 diabetes mellitus with other skin complications: Secondary | ICD-10-CM | POA: Diagnosis not present

## 2021-05-06 DIAGNOSIS — I493 Ventricular premature depolarization: Secondary | ICD-10-CM | POA: Diagnosis not present

## 2021-05-06 DIAGNOSIS — M79673 Pain in unspecified foot: Secondary | ICD-10-CM | POA: Diagnosis not present

## 2021-05-06 DIAGNOSIS — E11621 Type 2 diabetes mellitus with foot ulcer: Secondary | ICD-10-CM | POA: Diagnosis not present

## 2021-05-06 DIAGNOSIS — I96 Gangrene, not elsewhere classified: Secondary | ICD-10-CM | POA: Diagnosis not present

## 2021-05-06 DIAGNOSIS — Z20822 Contact with and (suspected) exposure to covid-19: Secondary | ICD-10-CM | POA: Diagnosis not present

## 2021-05-06 DIAGNOSIS — E7800 Pure hypercholesterolemia, unspecified: Secondary | ICD-10-CM | POA: Diagnosis not present

## 2021-05-06 DIAGNOSIS — Z955 Presence of coronary angioplasty implant and graft: Secondary | ICD-10-CM | POA: Diagnosis not present

## 2021-05-07 DIAGNOSIS — R5381 Other malaise: Secondary | ICD-10-CM | POA: Diagnosis not present

## 2021-05-07 DIAGNOSIS — E875 Hyperkalemia: Secondary | ICD-10-CM | POA: Diagnosis not present

## 2021-05-07 DIAGNOSIS — E559 Vitamin D deficiency, unspecified: Secondary | ICD-10-CM | POA: Diagnosis not present

## 2021-05-07 DIAGNOSIS — M8600 Acute hematogenous osteomyelitis, unspecified site: Secondary | ICD-10-CM | POA: Diagnosis not present

## 2021-05-07 DIAGNOSIS — L97919 Non-pressure chronic ulcer of unspecified part of right lower leg with unspecified severity: Secondary | ICD-10-CM | POA: Diagnosis not present

## 2021-05-07 DIAGNOSIS — Z79899 Other long term (current) drug therapy: Secondary | ICD-10-CM | POA: Diagnosis not present

## 2021-05-07 DIAGNOSIS — Z01818 Encounter for other preprocedural examination: Secondary | ICD-10-CM | POA: Diagnosis not present

## 2021-05-07 DIAGNOSIS — I70239 Atherosclerosis of native arteries of right leg with ulceration of unspecified site: Secondary | ICD-10-CM | POA: Diagnosis not present

## 2021-05-07 DIAGNOSIS — Z7984 Long term (current) use of oral hypoglycemic drugs: Secondary | ICD-10-CM | POA: Diagnosis not present

## 2021-05-07 DIAGNOSIS — M86172 Other acute osteomyelitis, left ankle and foot: Secondary | ICD-10-CM | POA: Diagnosis not present

## 2021-05-07 DIAGNOSIS — E872 Acidosis, unspecified: Secondary | ICD-10-CM | POA: Diagnosis not present

## 2021-05-07 DIAGNOSIS — I70211 Atherosclerosis of native arteries of extremities with intermittent claudication, right leg: Secondary | ICD-10-CM | POA: Diagnosis not present

## 2021-05-07 DIAGNOSIS — M86171 Other acute osteomyelitis, right ankle and foot: Secondary | ICD-10-CM | POA: Diagnosis not present

## 2021-05-07 DIAGNOSIS — Z955 Presence of coronary angioplasty implant and graft: Secondary | ICD-10-CM | POA: Diagnosis not present

## 2021-05-07 DIAGNOSIS — M79604 Pain in right leg: Secondary | ICD-10-CM | POA: Diagnosis not present

## 2021-05-07 DIAGNOSIS — I739 Peripheral vascular disease, unspecified: Secondary | ICD-10-CM | POA: Diagnosis not present

## 2021-05-07 DIAGNOSIS — E1142 Type 2 diabetes mellitus with diabetic polyneuropathy: Secondary | ICD-10-CM | POA: Diagnosis not present

## 2021-05-07 DIAGNOSIS — M8660 Other chronic osteomyelitis, unspecified site: Secondary | ICD-10-CM | POA: Diagnosis not present

## 2021-05-07 DIAGNOSIS — I159 Secondary hypertension, unspecified: Secondary | ICD-10-CM | POA: Diagnosis not present

## 2021-05-07 DIAGNOSIS — L97519 Non-pressure chronic ulcer of other part of right foot with unspecified severity: Secondary | ICD-10-CM | POA: Diagnosis not present

## 2021-05-07 DIAGNOSIS — E11621 Type 2 diabetes mellitus with foot ulcer: Secondary | ICD-10-CM | POA: Diagnosis not present

## 2021-05-07 DIAGNOSIS — I7025 Atherosclerosis of native arteries of other extremities with ulceration: Secondary | ICD-10-CM | POA: Diagnosis not present

## 2021-05-07 DIAGNOSIS — L97929 Non-pressure chronic ulcer of unspecified part of left lower leg with unspecified severity: Secondary | ICD-10-CM | POA: Diagnosis not present

## 2021-05-07 DIAGNOSIS — Z87891 Personal history of nicotine dependence: Secondary | ICD-10-CM | POA: Diagnosis not present

## 2021-05-07 DIAGNOSIS — L97529 Non-pressure chronic ulcer of other part of left foot with unspecified severity: Secondary | ICD-10-CM | POA: Diagnosis not present

## 2021-05-07 DIAGNOSIS — Z89429 Acquired absence of other toe(s), unspecified side: Secondary | ICD-10-CM | POA: Diagnosis not present

## 2021-05-07 DIAGNOSIS — E7800 Pure hypercholesterolemia, unspecified: Secondary | ICD-10-CM | POA: Diagnosis not present

## 2021-05-07 DIAGNOSIS — L97522 Non-pressure chronic ulcer of other part of left foot with fat layer exposed: Secondary | ICD-10-CM | POA: Diagnosis not present

## 2021-05-07 DIAGNOSIS — I96 Gangrene, not elsewhere classified: Secondary | ICD-10-CM | POA: Diagnosis not present

## 2021-05-07 DIAGNOSIS — E1152 Type 2 diabetes mellitus with diabetic peripheral angiopathy with gangrene: Secondary | ICD-10-CM | POA: Diagnosis not present

## 2021-05-07 DIAGNOSIS — E1169 Type 2 diabetes mellitus with other specified complication: Secondary | ICD-10-CM | POA: Diagnosis not present

## 2021-05-07 DIAGNOSIS — I70249 Atherosclerosis of native arteries of left leg with ulceration of unspecified site: Secondary | ICD-10-CM | POA: Diagnosis not present

## 2021-05-07 DIAGNOSIS — I10 Essential (primary) hypertension: Secondary | ICD-10-CM | POA: Diagnosis not present

## 2021-05-07 DIAGNOSIS — Z20822 Contact with and (suspected) exposure to covid-19: Secondary | ICD-10-CM | POA: Diagnosis not present

## 2021-05-07 DIAGNOSIS — K529 Noninfective gastroenteritis and colitis, unspecified: Secondary | ICD-10-CM | POA: Diagnosis not present

## 2021-05-07 DIAGNOSIS — I70361 Atherosclerosis of unspecified type of bypass graft(s) of the extremities with gangrene, right leg: Secondary | ICD-10-CM | POA: Diagnosis not present

## 2021-05-07 DIAGNOSIS — Z0389 Encounter for observation for other suspected diseases and conditions ruled out: Secondary | ICD-10-CM | POA: Diagnosis not present

## 2021-05-07 DIAGNOSIS — I70223 Atherosclerosis of native arteries of extremities with rest pain, bilateral legs: Secondary | ICD-10-CM | POA: Diagnosis not present

## 2021-05-07 DIAGNOSIS — I70262 Atherosclerosis of native arteries of extremities with gangrene, left leg: Secondary | ICD-10-CM | POA: Diagnosis not present

## 2021-05-07 DIAGNOSIS — M6250 Muscle wasting and atrophy, not elsewhere classified, unspecified site: Secondary | ICD-10-CM | POA: Diagnosis not present

## 2021-05-07 DIAGNOSIS — M79605 Pain in left leg: Secondary | ICD-10-CM | POA: Diagnosis not present

## 2021-05-07 DIAGNOSIS — I774 Celiac artery compression syndrome: Secondary | ICD-10-CM | POA: Diagnosis not present

## 2021-05-07 DIAGNOSIS — I252 Old myocardial infarction: Secondary | ICD-10-CM | POA: Diagnosis not present

## 2021-05-07 DIAGNOSIS — M86072 Acute hematogenous osteomyelitis, left ankle and foot: Secondary | ICD-10-CM | POA: Diagnosis not present

## 2021-05-07 DIAGNOSIS — Z9181 History of falling: Secondary | ICD-10-CM | POA: Diagnosis not present

## 2021-05-07 DIAGNOSIS — I70348 Atherosclerosis of unspecified type of bypass graft(s) of the left leg with ulceration of other part of lower leg: Secondary | ICD-10-CM | POA: Diagnosis not present

## 2021-05-07 DIAGNOSIS — I2510 Atherosclerotic heart disease of native coronary artery without angina pectoris: Secondary | ICD-10-CM | POA: Diagnosis not present

## 2021-05-07 DIAGNOSIS — I7090 Unspecified atherosclerosis: Secondary | ICD-10-CM | POA: Diagnosis not present

## 2021-05-07 DIAGNOSIS — E119 Type 2 diabetes mellitus without complications: Secondary | ICD-10-CM | POA: Diagnosis not present

## 2021-05-07 DIAGNOSIS — M868X7 Other osteomyelitis, ankle and foot: Secondary | ICD-10-CM | POA: Diagnosis not present

## 2021-05-07 DIAGNOSIS — Z885 Allergy status to narcotic agent status: Secondary | ICD-10-CM | POA: Diagnosis not present

## 2021-05-07 DIAGNOSIS — Z794 Long term (current) use of insulin: Secondary | ICD-10-CM | POA: Diagnosis not present

## 2021-05-07 DIAGNOSIS — I70263 Atherosclerosis of native arteries of extremities with gangrene, bilateral legs: Secondary | ICD-10-CM | POA: Diagnosis not present

## 2021-05-07 DIAGNOSIS — I70202 Unspecified atherosclerosis of native arteries of extremities, left leg: Secondary | ICD-10-CM | POA: Diagnosis not present

## 2021-05-08 DIAGNOSIS — M79605 Pain in left leg: Secondary | ICD-10-CM | POA: Diagnosis not present

## 2021-05-08 DIAGNOSIS — I96 Gangrene, not elsewhere classified: Secondary | ICD-10-CM | POA: Diagnosis not present

## 2021-05-08 DIAGNOSIS — I70223 Atherosclerosis of native arteries of extremities with rest pain, bilateral legs: Secondary | ICD-10-CM | POA: Diagnosis not present

## 2021-05-08 DIAGNOSIS — M79604 Pain in right leg: Secondary | ICD-10-CM | POA: Diagnosis not present

## 2021-05-08 DIAGNOSIS — M86072 Acute hematogenous osteomyelitis, left ankle and foot: Secondary | ICD-10-CM | POA: Diagnosis not present

## 2021-05-09 DIAGNOSIS — E872 Acidosis, unspecified: Secondary | ICD-10-CM | POA: Diagnosis not present

## 2021-05-09 DIAGNOSIS — E1169 Type 2 diabetes mellitus with other specified complication: Secondary | ICD-10-CM | POA: Diagnosis not present

## 2021-05-09 DIAGNOSIS — I96 Gangrene, not elsewhere classified: Secondary | ICD-10-CM | POA: Diagnosis not present

## 2021-05-09 DIAGNOSIS — I70223 Atherosclerosis of native arteries of extremities with rest pain, bilateral legs: Secondary | ICD-10-CM | POA: Diagnosis not present

## 2021-05-09 DIAGNOSIS — M79604 Pain in right leg: Secondary | ICD-10-CM | POA: Diagnosis not present

## 2021-05-09 DIAGNOSIS — Z794 Long term (current) use of insulin: Secondary | ICD-10-CM | POA: Diagnosis not present

## 2021-05-09 DIAGNOSIS — M86072 Acute hematogenous osteomyelitis, left ankle and foot: Secondary | ICD-10-CM | POA: Diagnosis not present

## 2021-05-09 DIAGNOSIS — M79605 Pain in left leg: Secondary | ICD-10-CM | POA: Diagnosis not present

## 2021-05-10 DIAGNOSIS — M79605 Pain in left leg: Secondary | ICD-10-CM | POA: Diagnosis not present

## 2021-05-10 DIAGNOSIS — M86072 Acute hematogenous osteomyelitis, left ankle and foot: Secondary | ICD-10-CM | POA: Diagnosis not present

## 2021-05-10 DIAGNOSIS — E872 Acidosis, unspecified: Secondary | ICD-10-CM | POA: Diagnosis not present

## 2021-05-10 DIAGNOSIS — M79604 Pain in right leg: Secondary | ICD-10-CM | POA: Diagnosis not present

## 2021-05-10 DIAGNOSIS — I96 Gangrene, not elsewhere classified: Secondary | ICD-10-CM | POA: Diagnosis not present

## 2021-05-10 DIAGNOSIS — I70262 Atherosclerosis of native arteries of extremities with gangrene, left leg: Secondary | ICD-10-CM | POA: Diagnosis not present

## 2021-05-10 DIAGNOSIS — E1169 Type 2 diabetes mellitus with other specified complication: Secondary | ICD-10-CM | POA: Diagnosis not present

## 2021-05-10 DIAGNOSIS — I70223 Atherosclerosis of native arteries of extremities with rest pain, bilateral legs: Secondary | ICD-10-CM | POA: Diagnosis not present

## 2021-05-11 DIAGNOSIS — I739 Peripheral vascular disease, unspecified: Secondary | ICD-10-CM | POA: Diagnosis not present

## 2021-05-11 DIAGNOSIS — Z794 Long term (current) use of insulin: Secondary | ICD-10-CM | POA: Diagnosis not present

## 2021-05-11 DIAGNOSIS — E1169 Type 2 diabetes mellitus with other specified complication: Secondary | ICD-10-CM | POA: Diagnosis not present

## 2021-05-11 DIAGNOSIS — E872 Acidosis, unspecified: Secondary | ICD-10-CM | POA: Diagnosis not present

## 2021-05-11 DIAGNOSIS — M86072 Acute hematogenous osteomyelitis, left ankle and foot: Secondary | ICD-10-CM | POA: Diagnosis not present

## 2021-05-11 DIAGNOSIS — I96 Gangrene, not elsewhere classified: Secondary | ICD-10-CM | POA: Diagnosis not present

## 2021-05-12 DIAGNOSIS — E872 Acidosis, unspecified: Secondary | ICD-10-CM | POA: Diagnosis not present

## 2021-05-12 DIAGNOSIS — E1169 Type 2 diabetes mellitus with other specified complication: Secondary | ICD-10-CM | POA: Diagnosis not present

## 2021-05-12 DIAGNOSIS — I96 Gangrene, not elsewhere classified: Secondary | ICD-10-CM | POA: Diagnosis not present

## 2021-05-12 DIAGNOSIS — I739 Peripheral vascular disease, unspecified: Secondary | ICD-10-CM | POA: Diagnosis not present

## 2021-05-12 DIAGNOSIS — M86072 Acute hematogenous osteomyelitis, left ankle and foot: Secondary | ICD-10-CM | POA: Diagnosis not present

## 2021-05-12 DIAGNOSIS — Z794 Long term (current) use of insulin: Secondary | ICD-10-CM | POA: Diagnosis not present

## 2021-05-13 DIAGNOSIS — I96 Gangrene, not elsewhere classified: Secondary | ICD-10-CM | POA: Diagnosis not present

## 2021-05-13 DIAGNOSIS — E1169 Type 2 diabetes mellitus with other specified complication: Secondary | ICD-10-CM | POA: Diagnosis not present

## 2021-05-13 DIAGNOSIS — E872 Acidosis, unspecified: Secondary | ICD-10-CM | POA: Diagnosis not present

## 2021-05-13 DIAGNOSIS — M79604 Pain in right leg: Secondary | ICD-10-CM | POA: Diagnosis not present

## 2021-05-13 DIAGNOSIS — Z794 Long term (current) use of insulin: Secondary | ICD-10-CM | POA: Diagnosis not present

## 2021-05-13 DIAGNOSIS — M86072 Acute hematogenous osteomyelitis, left ankle and foot: Secondary | ICD-10-CM | POA: Diagnosis not present

## 2021-05-13 DIAGNOSIS — M79605 Pain in left leg: Secondary | ICD-10-CM | POA: Diagnosis not present

## 2021-05-13 DIAGNOSIS — I70223 Atherosclerosis of native arteries of extremities with rest pain, bilateral legs: Secondary | ICD-10-CM | POA: Diagnosis not present

## 2021-05-14 DIAGNOSIS — E872 Acidosis, unspecified: Secondary | ICD-10-CM | POA: Diagnosis not present

## 2021-05-14 DIAGNOSIS — I96 Gangrene, not elsewhere classified: Secondary | ICD-10-CM | POA: Diagnosis not present

## 2021-05-14 DIAGNOSIS — I70223 Atherosclerosis of native arteries of extremities with rest pain, bilateral legs: Secondary | ICD-10-CM | POA: Diagnosis not present

## 2021-05-14 DIAGNOSIS — M79605 Pain in left leg: Secondary | ICD-10-CM | POA: Diagnosis not present

## 2021-05-14 DIAGNOSIS — E1169 Type 2 diabetes mellitus with other specified complication: Secondary | ICD-10-CM | POA: Diagnosis not present

## 2021-05-14 DIAGNOSIS — M79604 Pain in right leg: Secondary | ICD-10-CM | POA: Diagnosis not present

## 2021-05-14 DIAGNOSIS — Z794 Long term (current) use of insulin: Secondary | ICD-10-CM | POA: Diagnosis not present

## 2021-05-14 DIAGNOSIS — M86072 Acute hematogenous osteomyelitis, left ankle and foot: Secondary | ICD-10-CM | POA: Diagnosis not present

## 2021-05-15 DIAGNOSIS — E1169 Type 2 diabetes mellitus with other specified complication: Secondary | ICD-10-CM | POA: Diagnosis not present

## 2021-05-15 DIAGNOSIS — M86072 Acute hematogenous osteomyelitis, left ankle and foot: Secondary | ICD-10-CM | POA: Diagnosis not present

## 2021-05-15 DIAGNOSIS — E872 Acidosis, unspecified: Secondary | ICD-10-CM | POA: Diagnosis not present

## 2021-05-15 DIAGNOSIS — Z794 Long term (current) use of insulin: Secondary | ICD-10-CM | POA: Diagnosis not present

## 2021-05-15 DIAGNOSIS — I96 Gangrene, not elsewhere classified: Secondary | ICD-10-CM | POA: Diagnosis not present

## 2021-05-16 DIAGNOSIS — I96 Gangrene, not elsewhere classified: Secondary | ICD-10-CM | POA: Diagnosis not present

## 2021-05-16 DIAGNOSIS — M8660 Other chronic osteomyelitis, unspecified site: Secondary | ICD-10-CM | POA: Diagnosis not present

## 2021-05-16 DIAGNOSIS — M86072 Acute hematogenous osteomyelitis, left ankle and foot: Secondary | ICD-10-CM | POA: Diagnosis not present

## 2021-05-19 DIAGNOSIS — E1151 Type 2 diabetes mellitus with diabetic peripheral angiopathy without gangrene: Secondary | ICD-10-CM | POA: Diagnosis not present

## 2021-05-19 DIAGNOSIS — Z7951 Long term (current) use of inhaled steroids: Secondary | ICD-10-CM | POA: Diagnosis not present

## 2021-05-19 DIAGNOSIS — I10 Essential (primary) hypertension: Secondary | ICD-10-CM | POA: Diagnosis not present

## 2021-05-19 DIAGNOSIS — I70348 Atherosclerosis of unspecified type of bypass graft(s) of the left leg with ulceration of other part of lower leg: Secondary | ICD-10-CM | POA: Diagnosis not present

## 2021-05-19 DIAGNOSIS — Z87891 Personal history of nicotine dependence: Secondary | ICD-10-CM | POA: Diagnosis not present

## 2021-05-19 DIAGNOSIS — Z4781 Encounter for orthopedic aftercare following surgical amputation: Secondary | ICD-10-CM | POA: Diagnosis not present

## 2021-05-19 DIAGNOSIS — Z4801 Encounter for change or removal of surgical wound dressing: Secondary | ICD-10-CM | POA: Diagnosis not present

## 2021-05-19 DIAGNOSIS — I2510 Atherosclerotic heart disease of native coronary artery without angina pectoris: Secondary | ICD-10-CM | POA: Diagnosis not present

## 2021-05-19 DIAGNOSIS — Z89411 Acquired absence of right great toe: Secondary | ICD-10-CM | POA: Diagnosis not present

## 2021-05-19 DIAGNOSIS — L97528 Non-pressure chronic ulcer of other part of left foot with other specified severity: Secondary | ICD-10-CM | POA: Diagnosis not present

## 2021-05-19 DIAGNOSIS — I252 Old myocardial infarction: Secondary | ICD-10-CM | POA: Diagnosis not present

## 2021-05-19 DIAGNOSIS — Z48812 Encounter for surgical aftercare following surgery on the circulatory system: Secondary | ICD-10-CM | POA: Diagnosis not present

## 2021-05-19 DIAGNOSIS — Z794 Long term (current) use of insulin: Secondary | ICD-10-CM | POA: Diagnosis not present

## 2021-05-19 DIAGNOSIS — E559 Vitamin D deficiency, unspecified: Secondary | ICD-10-CM | POA: Diagnosis not present

## 2021-05-19 DIAGNOSIS — E7800 Pure hypercholesterolemia, unspecified: Secondary | ICD-10-CM | POA: Diagnosis not present

## 2021-05-19 DIAGNOSIS — Z7902 Long term (current) use of antithrombotics/antiplatelets: Secondary | ICD-10-CM | POA: Diagnosis not present

## 2021-05-19 DIAGNOSIS — E11621 Type 2 diabetes mellitus with foot ulcer: Secondary | ICD-10-CM | POA: Diagnosis not present

## 2021-05-19 DIAGNOSIS — Z95820 Peripheral vascular angioplasty status with implants and grafts: Secondary | ICD-10-CM | POA: Diagnosis not present

## 2021-05-20 DIAGNOSIS — I70348 Atherosclerosis of unspecified type of bypass graft(s) of the left leg with ulceration of other part of lower leg: Secondary | ICD-10-CM | POA: Diagnosis not present

## 2021-05-20 DIAGNOSIS — Z4801 Encounter for change or removal of surgical wound dressing: Secondary | ICD-10-CM | POA: Diagnosis not present

## 2021-05-20 DIAGNOSIS — Z89411 Acquired absence of right great toe: Secondary | ICD-10-CM | POA: Diagnosis not present

## 2021-05-20 DIAGNOSIS — I10 Essential (primary) hypertension: Secondary | ICD-10-CM | POA: Diagnosis not present

## 2021-05-20 DIAGNOSIS — I252 Old myocardial infarction: Secondary | ICD-10-CM | POA: Diagnosis not present

## 2021-05-20 DIAGNOSIS — Z87891 Personal history of nicotine dependence: Secondary | ICD-10-CM | POA: Diagnosis not present

## 2021-05-20 DIAGNOSIS — I2510 Atherosclerotic heart disease of native coronary artery without angina pectoris: Secondary | ICD-10-CM | POA: Diagnosis not present

## 2021-05-20 DIAGNOSIS — E11621 Type 2 diabetes mellitus with foot ulcer: Secondary | ICD-10-CM | POA: Diagnosis not present

## 2021-05-20 DIAGNOSIS — Z7902 Long term (current) use of antithrombotics/antiplatelets: Secondary | ICD-10-CM | POA: Diagnosis not present

## 2021-05-20 DIAGNOSIS — Z95820 Peripheral vascular angioplasty status with implants and grafts: Secondary | ICD-10-CM | POA: Diagnosis not present

## 2021-05-20 DIAGNOSIS — Z48812 Encounter for surgical aftercare following surgery on the circulatory system: Secondary | ICD-10-CM | POA: Diagnosis not present

## 2021-05-20 DIAGNOSIS — E1151 Type 2 diabetes mellitus with diabetic peripheral angiopathy without gangrene: Secondary | ICD-10-CM | POA: Diagnosis not present

## 2021-05-20 DIAGNOSIS — Z7951 Long term (current) use of inhaled steroids: Secondary | ICD-10-CM | POA: Diagnosis not present

## 2021-05-20 DIAGNOSIS — L97528 Non-pressure chronic ulcer of other part of left foot with other specified severity: Secondary | ICD-10-CM | POA: Diagnosis not present

## 2021-05-20 DIAGNOSIS — Z4781 Encounter for orthopedic aftercare following surgical amputation: Secondary | ICD-10-CM | POA: Diagnosis not present

## 2021-05-20 DIAGNOSIS — Z794 Long term (current) use of insulin: Secondary | ICD-10-CM | POA: Diagnosis not present

## 2021-05-20 DIAGNOSIS — E7800 Pure hypercholesterolemia, unspecified: Secondary | ICD-10-CM | POA: Diagnosis not present

## 2021-05-20 DIAGNOSIS — E559 Vitamin D deficiency, unspecified: Secondary | ICD-10-CM | POA: Diagnosis not present

## 2021-05-22 DIAGNOSIS — Z87891 Personal history of nicotine dependence: Secondary | ICD-10-CM | POA: Diagnosis not present

## 2021-05-22 DIAGNOSIS — Z4781 Encounter for orthopedic aftercare following surgical amputation: Secondary | ICD-10-CM | POA: Diagnosis not present

## 2021-05-22 DIAGNOSIS — Z48812 Encounter for surgical aftercare following surgery on the circulatory system: Secondary | ICD-10-CM | POA: Diagnosis not present

## 2021-05-22 DIAGNOSIS — Z89411 Acquired absence of right great toe: Secondary | ICD-10-CM | POA: Diagnosis not present

## 2021-05-22 DIAGNOSIS — I70348 Atherosclerosis of unspecified type of bypass graft(s) of the left leg with ulceration of other part of lower leg: Secondary | ICD-10-CM | POA: Diagnosis not present

## 2021-05-22 DIAGNOSIS — E1151 Type 2 diabetes mellitus with diabetic peripheral angiopathy without gangrene: Secondary | ICD-10-CM | POA: Diagnosis not present

## 2021-05-22 DIAGNOSIS — Z95820 Peripheral vascular angioplasty status with implants and grafts: Secondary | ICD-10-CM | POA: Diagnosis not present

## 2021-05-22 DIAGNOSIS — I2510 Atherosclerotic heart disease of native coronary artery without angina pectoris: Secondary | ICD-10-CM | POA: Diagnosis not present

## 2021-05-22 DIAGNOSIS — E7800 Pure hypercholesterolemia, unspecified: Secondary | ICD-10-CM | POA: Diagnosis not present

## 2021-05-22 DIAGNOSIS — Z4801 Encounter for change or removal of surgical wound dressing: Secondary | ICD-10-CM | POA: Diagnosis not present

## 2021-05-22 DIAGNOSIS — I10 Essential (primary) hypertension: Secondary | ICD-10-CM | POA: Diagnosis not present

## 2021-05-22 DIAGNOSIS — Z7951 Long term (current) use of inhaled steroids: Secondary | ICD-10-CM | POA: Diagnosis not present

## 2021-05-22 DIAGNOSIS — L97528 Non-pressure chronic ulcer of other part of left foot with other specified severity: Secondary | ICD-10-CM | POA: Diagnosis not present

## 2021-05-22 DIAGNOSIS — Z7902 Long term (current) use of antithrombotics/antiplatelets: Secondary | ICD-10-CM | POA: Diagnosis not present

## 2021-05-22 DIAGNOSIS — E11621 Type 2 diabetes mellitus with foot ulcer: Secondary | ICD-10-CM | POA: Diagnosis not present

## 2021-05-22 DIAGNOSIS — I252 Old myocardial infarction: Secondary | ICD-10-CM | POA: Diagnosis not present

## 2021-05-22 DIAGNOSIS — E559 Vitamin D deficiency, unspecified: Secondary | ICD-10-CM | POA: Diagnosis not present

## 2021-05-22 DIAGNOSIS — Z794 Long term (current) use of insulin: Secondary | ICD-10-CM | POA: Diagnosis not present

## 2021-05-24 DIAGNOSIS — E7800 Pure hypercholesterolemia, unspecified: Secondary | ICD-10-CM | POA: Diagnosis not present

## 2021-05-24 DIAGNOSIS — E559 Vitamin D deficiency, unspecified: Secondary | ICD-10-CM | POA: Diagnosis not present

## 2021-05-24 DIAGNOSIS — Z87891 Personal history of nicotine dependence: Secondary | ICD-10-CM | POA: Diagnosis not present

## 2021-05-24 DIAGNOSIS — Z7951 Long term (current) use of inhaled steroids: Secondary | ICD-10-CM | POA: Diagnosis not present

## 2021-05-24 DIAGNOSIS — Z4781 Encounter for orthopedic aftercare following surgical amputation: Secondary | ICD-10-CM | POA: Diagnosis not present

## 2021-05-24 DIAGNOSIS — I70348 Atherosclerosis of unspecified type of bypass graft(s) of the left leg with ulceration of other part of lower leg: Secondary | ICD-10-CM | POA: Diagnosis not present

## 2021-05-24 DIAGNOSIS — I252 Old myocardial infarction: Secondary | ICD-10-CM | POA: Diagnosis not present

## 2021-05-24 DIAGNOSIS — E11621 Type 2 diabetes mellitus with foot ulcer: Secondary | ICD-10-CM | POA: Diagnosis not present

## 2021-05-24 DIAGNOSIS — L97528 Non-pressure chronic ulcer of other part of left foot with other specified severity: Secondary | ICD-10-CM | POA: Diagnosis not present

## 2021-05-24 DIAGNOSIS — Z7902 Long term (current) use of antithrombotics/antiplatelets: Secondary | ICD-10-CM | POA: Diagnosis not present

## 2021-05-24 DIAGNOSIS — I2510 Atherosclerotic heart disease of native coronary artery without angina pectoris: Secondary | ICD-10-CM | POA: Diagnosis not present

## 2021-05-24 DIAGNOSIS — I10 Essential (primary) hypertension: Secondary | ICD-10-CM | POA: Diagnosis not present

## 2021-05-24 DIAGNOSIS — E1151 Type 2 diabetes mellitus with diabetic peripheral angiopathy without gangrene: Secondary | ICD-10-CM | POA: Diagnosis not present

## 2021-05-24 DIAGNOSIS — Z95820 Peripheral vascular angioplasty status with implants and grafts: Secondary | ICD-10-CM | POA: Diagnosis not present

## 2021-05-24 DIAGNOSIS — Z794 Long term (current) use of insulin: Secondary | ICD-10-CM | POA: Diagnosis not present

## 2021-05-24 DIAGNOSIS — Z4801 Encounter for change or removal of surgical wound dressing: Secondary | ICD-10-CM | POA: Diagnosis not present

## 2021-05-24 DIAGNOSIS — Z89411 Acquired absence of right great toe: Secondary | ICD-10-CM | POA: Diagnosis not present

## 2021-05-24 DIAGNOSIS — Z48812 Encounter for surgical aftercare following surgery on the circulatory system: Secondary | ICD-10-CM | POA: Diagnosis not present

## 2021-05-26 DIAGNOSIS — Z7951 Long term (current) use of inhaled steroids: Secondary | ICD-10-CM | POA: Diagnosis not present

## 2021-05-26 DIAGNOSIS — Z87891 Personal history of nicotine dependence: Secondary | ICD-10-CM | POA: Diagnosis not present

## 2021-05-26 DIAGNOSIS — Z48812 Encounter for surgical aftercare following surgery on the circulatory system: Secondary | ICD-10-CM | POA: Diagnosis not present

## 2021-05-26 DIAGNOSIS — E11621 Type 2 diabetes mellitus with foot ulcer: Secondary | ICD-10-CM | POA: Diagnosis not present

## 2021-05-26 DIAGNOSIS — Z4781 Encounter for orthopedic aftercare following surgical amputation: Secondary | ICD-10-CM | POA: Diagnosis not present

## 2021-05-26 DIAGNOSIS — Z7902 Long term (current) use of antithrombotics/antiplatelets: Secondary | ICD-10-CM | POA: Diagnosis not present

## 2021-05-26 DIAGNOSIS — L97528 Non-pressure chronic ulcer of other part of left foot with other specified severity: Secondary | ICD-10-CM | POA: Diagnosis not present

## 2021-05-26 DIAGNOSIS — Z4801 Encounter for change or removal of surgical wound dressing: Secondary | ICD-10-CM | POA: Diagnosis not present

## 2021-05-26 DIAGNOSIS — I2510 Atherosclerotic heart disease of native coronary artery without angina pectoris: Secondary | ICD-10-CM | POA: Diagnosis not present

## 2021-05-26 DIAGNOSIS — E1151 Type 2 diabetes mellitus with diabetic peripheral angiopathy without gangrene: Secondary | ICD-10-CM | POA: Diagnosis not present

## 2021-05-26 DIAGNOSIS — E559 Vitamin D deficiency, unspecified: Secondary | ICD-10-CM | POA: Diagnosis not present

## 2021-05-26 DIAGNOSIS — Z95820 Peripheral vascular angioplasty status with implants and grafts: Secondary | ICD-10-CM | POA: Diagnosis not present

## 2021-05-26 DIAGNOSIS — Z89411 Acquired absence of right great toe: Secondary | ICD-10-CM | POA: Diagnosis not present

## 2021-05-26 DIAGNOSIS — Z794 Long term (current) use of insulin: Secondary | ICD-10-CM | POA: Diagnosis not present

## 2021-05-26 DIAGNOSIS — I10 Essential (primary) hypertension: Secondary | ICD-10-CM | POA: Diagnosis not present

## 2021-05-26 DIAGNOSIS — I70348 Atherosclerosis of unspecified type of bypass graft(s) of the left leg with ulceration of other part of lower leg: Secondary | ICD-10-CM | POA: Diagnosis not present

## 2021-05-26 DIAGNOSIS — I252 Old myocardial infarction: Secondary | ICD-10-CM | POA: Diagnosis not present

## 2021-05-26 DIAGNOSIS — E7800 Pure hypercholesterolemia, unspecified: Secondary | ICD-10-CM | POA: Diagnosis not present

## 2021-05-29 DIAGNOSIS — L97528 Non-pressure chronic ulcer of other part of left foot with other specified severity: Secondary | ICD-10-CM | POA: Diagnosis not present

## 2021-05-29 DIAGNOSIS — Z7902 Long term (current) use of antithrombotics/antiplatelets: Secondary | ICD-10-CM | POA: Diagnosis not present

## 2021-05-29 DIAGNOSIS — Z48812 Encounter for surgical aftercare following surgery on the circulatory system: Secondary | ICD-10-CM | POA: Diagnosis not present

## 2021-05-29 DIAGNOSIS — I252 Old myocardial infarction: Secondary | ICD-10-CM | POA: Diagnosis not present

## 2021-05-29 DIAGNOSIS — Z7951 Long term (current) use of inhaled steroids: Secondary | ICD-10-CM | POA: Diagnosis not present

## 2021-05-29 DIAGNOSIS — I2510 Atherosclerotic heart disease of native coronary artery without angina pectoris: Secondary | ICD-10-CM | POA: Diagnosis not present

## 2021-05-29 DIAGNOSIS — Z4801 Encounter for change or removal of surgical wound dressing: Secondary | ICD-10-CM | POA: Diagnosis not present

## 2021-05-29 DIAGNOSIS — Z95820 Peripheral vascular angioplasty status with implants and grafts: Secondary | ICD-10-CM | POA: Diagnosis not present

## 2021-05-29 DIAGNOSIS — I70348 Atherosclerosis of unspecified type of bypass graft(s) of the left leg with ulceration of other part of lower leg: Secondary | ICD-10-CM | POA: Diagnosis not present

## 2021-05-29 DIAGNOSIS — Z89411 Acquired absence of right great toe: Secondary | ICD-10-CM | POA: Diagnosis not present

## 2021-05-29 DIAGNOSIS — Z4781 Encounter for orthopedic aftercare following surgical amputation: Secondary | ICD-10-CM | POA: Diagnosis not present

## 2021-05-29 DIAGNOSIS — E11621 Type 2 diabetes mellitus with foot ulcer: Secondary | ICD-10-CM | POA: Diagnosis not present

## 2021-05-29 DIAGNOSIS — E1151 Type 2 diabetes mellitus with diabetic peripheral angiopathy without gangrene: Secondary | ICD-10-CM | POA: Diagnosis not present

## 2021-05-29 DIAGNOSIS — E7800 Pure hypercholesterolemia, unspecified: Secondary | ICD-10-CM | POA: Diagnosis not present

## 2021-05-29 DIAGNOSIS — Z87891 Personal history of nicotine dependence: Secondary | ICD-10-CM | POA: Diagnosis not present

## 2021-05-29 DIAGNOSIS — Z794 Long term (current) use of insulin: Secondary | ICD-10-CM | POA: Diagnosis not present

## 2021-05-29 DIAGNOSIS — E559 Vitamin D deficiency, unspecified: Secondary | ICD-10-CM | POA: Diagnosis not present

## 2021-05-29 DIAGNOSIS — I10 Essential (primary) hypertension: Secondary | ICD-10-CM | POA: Diagnosis not present

## 2021-05-31 DIAGNOSIS — I2510 Atherosclerotic heart disease of native coronary artery without angina pectoris: Secondary | ICD-10-CM | POA: Diagnosis not present

## 2021-05-31 DIAGNOSIS — Z89411 Acquired absence of right great toe: Secondary | ICD-10-CM | POA: Diagnosis not present

## 2021-05-31 DIAGNOSIS — Z95820 Peripheral vascular angioplasty status with implants and grafts: Secondary | ICD-10-CM | POA: Diagnosis not present

## 2021-05-31 DIAGNOSIS — Z4801 Encounter for change or removal of surgical wound dressing: Secondary | ICD-10-CM | POA: Diagnosis not present

## 2021-05-31 DIAGNOSIS — E11621 Type 2 diabetes mellitus with foot ulcer: Secondary | ICD-10-CM | POA: Diagnosis not present

## 2021-05-31 DIAGNOSIS — L97528 Non-pressure chronic ulcer of other part of left foot with other specified severity: Secondary | ICD-10-CM | POA: Diagnosis not present

## 2021-05-31 DIAGNOSIS — Z48812 Encounter for surgical aftercare following surgery on the circulatory system: Secondary | ICD-10-CM | POA: Diagnosis not present

## 2021-05-31 DIAGNOSIS — E1151 Type 2 diabetes mellitus with diabetic peripheral angiopathy without gangrene: Secondary | ICD-10-CM | POA: Diagnosis not present

## 2021-05-31 DIAGNOSIS — Z87891 Personal history of nicotine dependence: Secondary | ICD-10-CM | POA: Diagnosis not present

## 2021-05-31 DIAGNOSIS — I70348 Atherosclerosis of unspecified type of bypass graft(s) of the left leg with ulceration of other part of lower leg: Secondary | ICD-10-CM | POA: Diagnosis not present

## 2021-05-31 DIAGNOSIS — E7800 Pure hypercholesterolemia, unspecified: Secondary | ICD-10-CM | POA: Diagnosis not present

## 2021-05-31 DIAGNOSIS — I252 Old myocardial infarction: Secondary | ICD-10-CM | POA: Diagnosis not present

## 2021-05-31 DIAGNOSIS — E559 Vitamin D deficiency, unspecified: Secondary | ICD-10-CM | POA: Diagnosis not present

## 2021-05-31 DIAGNOSIS — Z4781 Encounter for orthopedic aftercare following surgical amputation: Secondary | ICD-10-CM | POA: Diagnosis not present

## 2021-05-31 DIAGNOSIS — I10 Essential (primary) hypertension: Secondary | ICD-10-CM | POA: Diagnosis not present

## 2021-05-31 DIAGNOSIS — Z7951 Long term (current) use of inhaled steroids: Secondary | ICD-10-CM | POA: Diagnosis not present

## 2021-05-31 DIAGNOSIS — Z7902 Long term (current) use of antithrombotics/antiplatelets: Secondary | ICD-10-CM | POA: Diagnosis not present

## 2021-05-31 DIAGNOSIS — Z794 Long term (current) use of insulin: Secondary | ICD-10-CM | POA: Diagnosis not present

## 2021-06-02 DIAGNOSIS — Z4781 Encounter for orthopedic aftercare following surgical amputation: Secondary | ICD-10-CM | POA: Diagnosis not present

## 2021-06-02 DIAGNOSIS — Z95820 Peripheral vascular angioplasty status with implants and grafts: Secondary | ICD-10-CM | POA: Diagnosis not present

## 2021-06-02 DIAGNOSIS — Z48812 Encounter for surgical aftercare following surgery on the circulatory system: Secondary | ICD-10-CM | POA: Diagnosis not present

## 2021-06-02 DIAGNOSIS — I10 Essential (primary) hypertension: Secondary | ICD-10-CM | POA: Diagnosis not present

## 2021-06-02 DIAGNOSIS — Z4801 Encounter for change or removal of surgical wound dressing: Secondary | ICD-10-CM | POA: Diagnosis not present

## 2021-06-02 DIAGNOSIS — Z89411 Acquired absence of right great toe: Secondary | ICD-10-CM | POA: Diagnosis not present

## 2021-06-02 DIAGNOSIS — Z794 Long term (current) use of insulin: Secondary | ICD-10-CM | POA: Diagnosis not present

## 2021-06-02 DIAGNOSIS — I2510 Atherosclerotic heart disease of native coronary artery without angina pectoris: Secondary | ICD-10-CM | POA: Diagnosis not present

## 2021-06-02 DIAGNOSIS — E1151 Type 2 diabetes mellitus with diabetic peripheral angiopathy without gangrene: Secondary | ICD-10-CM | POA: Diagnosis not present

## 2021-06-02 DIAGNOSIS — E559 Vitamin D deficiency, unspecified: Secondary | ICD-10-CM | POA: Diagnosis not present

## 2021-06-02 DIAGNOSIS — Z7951 Long term (current) use of inhaled steroids: Secondary | ICD-10-CM | POA: Diagnosis not present

## 2021-06-02 DIAGNOSIS — E11621 Type 2 diabetes mellitus with foot ulcer: Secondary | ICD-10-CM | POA: Diagnosis not present

## 2021-06-02 DIAGNOSIS — Z87891 Personal history of nicotine dependence: Secondary | ICD-10-CM | POA: Diagnosis not present

## 2021-06-02 DIAGNOSIS — L97528 Non-pressure chronic ulcer of other part of left foot with other specified severity: Secondary | ICD-10-CM | POA: Diagnosis not present

## 2021-06-02 DIAGNOSIS — E7800 Pure hypercholesterolemia, unspecified: Secondary | ICD-10-CM | POA: Diagnosis not present

## 2021-06-02 DIAGNOSIS — I70348 Atherosclerosis of unspecified type of bypass graft(s) of the left leg with ulceration of other part of lower leg: Secondary | ICD-10-CM | POA: Diagnosis not present

## 2021-06-02 DIAGNOSIS — I252 Old myocardial infarction: Secondary | ICD-10-CM | POA: Diagnosis not present

## 2021-06-02 DIAGNOSIS — Z7902 Long term (current) use of antithrombotics/antiplatelets: Secondary | ICD-10-CM | POA: Diagnosis not present

## 2021-06-03 DIAGNOSIS — Z4801 Encounter for change or removal of surgical wound dressing: Secondary | ICD-10-CM | POA: Diagnosis not present

## 2021-06-03 DIAGNOSIS — Z4781 Encounter for orthopedic aftercare following surgical amputation: Secondary | ICD-10-CM | POA: Diagnosis not present

## 2021-06-03 DIAGNOSIS — I252 Old myocardial infarction: Secondary | ICD-10-CM | POA: Diagnosis not present

## 2021-06-03 DIAGNOSIS — Z87891 Personal history of nicotine dependence: Secondary | ICD-10-CM | POA: Diagnosis not present

## 2021-06-03 DIAGNOSIS — Z7902 Long term (current) use of antithrombotics/antiplatelets: Secondary | ICD-10-CM | POA: Diagnosis not present

## 2021-06-03 DIAGNOSIS — L97528 Non-pressure chronic ulcer of other part of left foot with other specified severity: Secondary | ICD-10-CM | POA: Diagnosis not present

## 2021-06-03 DIAGNOSIS — I10 Essential (primary) hypertension: Secondary | ICD-10-CM | POA: Diagnosis not present

## 2021-06-03 DIAGNOSIS — Z7951 Long term (current) use of inhaled steroids: Secondary | ICD-10-CM | POA: Diagnosis not present

## 2021-06-03 DIAGNOSIS — Z89411 Acquired absence of right great toe: Secondary | ICD-10-CM | POA: Diagnosis not present

## 2021-06-03 DIAGNOSIS — Z95820 Peripheral vascular angioplasty status with implants and grafts: Secondary | ICD-10-CM | POA: Diagnosis not present

## 2021-06-03 DIAGNOSIS — E559 Vitamin D deficiency, unspecified: Secondary | ICD-10-CM | POA: Diagnosis not present

## 2021-06-03 DIAGNOSIS — I2510 Atherosclerotic heart disease of native coronary artery without angina pectoris: Secondary | ICD-10-CM | POA: Diagnosis not present

## 2021-06-03 DIAGNOSIS — E1151 Type 2 diabetes mellitus with diabetic peripheral angiopathy without gangrene: Secondary | ICD-10-CM | POA: Diagnosis not present

## 2021-06-03 DIAGNOSIS — I70348 Atherosclerosis of unspecified type of bypass graft(s) of the left leg with ulceration of other part of lower leg: Secondary | ICD-10-CM | POA: Diagnosis not present

## 2021-06-03 DIAGNOSIS — E11621 Type 2 diabetes mellitus with foot ulcer: Secondary | ICD-10-CM | POA: Diagnosis not present

## 2021-06-03 DIAGNOSIS — Z48812 Encounter for surgical aftercare following surgery on the circulatory system: Secondary | ICD-10-CM | POA: Diagnosis not present

## 2021-06-03 DIAGNOSIS — E7800 Pure hypercholesterolemia, unspecified: Secondary | ICD-10-CM | POA: Diagnosis not present

## 2021-06-03 DIAGNOSIS — Z794 Long term (current) use of insulin: Secondary | ICD-10-CM | POA: Diagnosis not present

## 2021-06-04 DIAGNOSIS — E1151 Type 2 diabetes mellitus with diabetic peripheral angiopathy without gangrene: Secondary | ICD-10-CM | POA: Diagnosis not present

## 2021-06-04 DIAGNOSIS — I2510 Atherosclerotic heart disease of native coronary artery without angina pectoris: Secondary | ICD-10-CM | POA: Diagnosis not present

## 2021-06-04 DIAGNOSIS — Z7951 Long term (current) use of inhaled steroids: Secondary | ICD-10-CM | POA: Diagnosis not present

## 2021-06-04 DIAGNOSIS — I70348 Atherosclerosis of unspecified type of bypass graft(s) of the left leg with ulceration of other part of lower leg: Secondary | ICD-10-CM | POA: Diagnosis not present

## 2021-06-04 DIAGNOSIS — Z87891 Personal history of nicotine dependence: Secondary | ICD-10-CM | POA: Diagnosis not present

## 2021-06-04 DIAGNOSIS — I252 Old myocardial infarction: Secondary | ICD-10-CM | POA: Diagnosis not present

## 2021-06-04 DIAGNOSIS — Z794 Long term (current) use of insulin: Secondary | ICD-10-CM | POA: Diagnosis not present

## 2021-06-04 DIAGNOSIS — Z4781 Encounter for orthopedic aftercare following surgical amputation: Secondary | ICD-10-CM | POA: Diagnosis not present

## 2021-06-04 DIAGNOSIS — I10 Essential (primary) hypertension: Secondary | ICD-10-CM | POA: Diagnosis not present

## 2021-06-04 DIAGNOSIS — Z4801 Encounter for change or removal of surgical wound dressing: Secondary | ICD-10-CM | POA: Diagnosis not present

## 2021-06-04 DIAGNOSIS — Z48812 Encounter for surgical aftercare following surgery on the circulatory system: Secondary | ICD-10-CM | POA: Diagnosis not present

## 2021-06-04 DIAGNOSIS — E11621 Type 2 diabetes mellitus with foot ulcer: Secondary | ICD-10-CM | POA: Diagnosis not present

## 2021-06-04 DIAGNOSIS — L97528 Non-pressure chronic ulcer of other part of left foot with other specified severity: Secondary | ICD-10-CM | POA: Diagnosis not present

## 2021-06-04 DIAGNOSIS — Z89411 Acquired absence of right great toe: Secondary | ICD-10-CM | POA: Diagnosis not present

## 2021-06-04 DIAGNOSIS — Z7902 Long term (current) use of antithrombotics/antiplatelets: Secondary | ICD-10-CM | POA: Diagnosis not present

## 2021-06-04 DIAGNOSIS — E7800 Pure hypercholesterolemia, unspecified: Secondary | ICD-10-CM | POA: Diagnosis not present

## 2021-06-04 DIAGNOSIS — Z95820 Peripheral vascular angioplasty status with implants and grafts: Secondary | ICD-10-CM | POA: Diagnosis not present

## 2021-06-04 DIAGNOSIS — E559 Vitamin D deficiency, unspecified: Secondary | ICD-10-CM | POA: Diagnosis not present

## 2021-06-06 DIAGNOSIS — Z7951 Long term (current) use of inhaled steroids: Secondary | ICD-10-CM | POA: Diagnosis not present

## 2021-06-06 DIAGNOSIS — Z4781 Encounter for orthopedic aftercare following surgical amputation: Secondary | ICD-10-CM | POA: Diagnosis not present

## 2021-06-06 DIAGNOSIS — Z95820 Peripheral vascular angioplasty status with implants and grafts: Secondary | ICD-10-CM | POA: Diagnosis not present

## 2021-06-06 DIAGNOSIS — I70348 Atherosclerosis of unspecified type of bypass graft(s) of the left leg with ulceration of other part of lower leg: Secondary | ICD-10-CM | POA: Diagnosis not present

## 2021-06-06 DIAGNOSIS — I252 Old myocardial infarction: Secondary | ICD-10-CM | POA: Diagnosis not present

## 2021-06-06 DIAGNOSIS — Z7902 Long term (current) use of antithrombotics/antiplatelets: Secondary | ICD-10-CM | POA: Diagnosis not present

## 2021-06-06 DIAGNOSIS — Z794 Long term (current) use of insulin: Secondary | ICD-10-CM | POA: Diagnosis not present

## 2021-06-06 DIAGNOSIS — Z4801 Encounter for change or removal of surgical wound dressing: Secondary | ICD-10-CM | POA: Diagnosis not present

## 2021-06-06 DIAGNOSIS — I2510 Atherosclerotic heart disease of native coronary artery without angina pectoris: Secondary | ICD-10-CM | POA: Diagnosis not present

## 2021-06-06 DIAGNOSIS — I10 Essential (primary) hypertension: Secondary | ICD-10-CM | POA: Diagnosis not present

## 2021-06-06 DIAGNOSIS — Z87891 Personal history of nicotine dependence: Secondary | ICD-10-CM | POA: Diagnosis not present

## 2021-06-06 DIAGNOSIS — E11621 Type 2 diabetes mellitus with foot ulcer: Secondary | ICD-10-CM | POA: Diagnosis not present

## 2021-06-06 DIAGNOSIS — E7800 Pure hypercholesterolemia, unspecified: Secondary | ICD-10-CM | POA: Diagnosis not present

## 2021-06-06 DIAGNOSIS — E559 Vitamin D deficiency, unspecified: Secondary | ICD-10-CM | POA: Diagnosis not present

## 2021-06-06 DIAGNOSIS — Z48812 Encounter for surgical aftercare following surgery on the circulatory system: Secondary | ICD-10-CM | POA: Diagnosis not present

## 2021-06-06 DIAGNOSIS — E1151 Type 2 diabetes mellitus with diabetic peripheral angiopathy without gangrene: Secondary | ICD-10-CM | POA: Diagnosis not present

## 2021-06-06 DIAGNOSIS — Z89411 Acquired absence of right great toe: Secondary | ICD-10-CM | POA: Diagnosis not present

## 2021-06-06 DIAGNOSIS — L97528 Non-pressure chronic ulcer of other part of left foot with other specified severity: Secondary | ICD-10-CM | POA: Diagnosis not present

## 2021-06-10 DIAGNOSIS — Z4781 Encounter for orthopedic aftercare following surgical amputation: Secondary | ICD-10-CM | POA: Diagnosis not present

## 2021-06-10 DIAGNOSIS — E11621 Type 2 diabetes mellitus with foot ulcer: Secondary | ICD-10-CM | POA: Diagnosis not present

## 2021-06-10 DIAGNOSIS — Z87891 Personal history of nicotine dependence: Secondary | ICD-10-CM | POA: Diagnosis not present

## 2021-06-10 DIAGNOSIS — Z7902 Long term (current) use of antithrombotics/antiplatelets: Secondary | ICD-10-CM | POA: Diagnosis not present

## 2021-06-10 DIAGNOSIS — E1151 Type 2 diabetes mellitus with diabetic peripheral angiopathy without gangrene: Secondary | ICD-10-CM | POA: Diagnosis not present

## 2021-06-10 DIAGNOSIS — I70348 Atherosclerosis of unspecified type of bypass graft(s) of the left leg with ulceration of other part of lower leg: Secondary | ICD-10-CM | POA: Diagnosis not present

## 2021-06-10 DIAGNOSIS — Z4801 Encounter for change or removal of surgical wound dressing: Secondary | ICD-10-CM | POA: Diagnosis not present

## 2021-06-10 DIAGNOSIS — I2510 Atherosclerotic heart disease of native coronary artery without angina pectoris: Secondary | ICD-10-CM | POA: Diagnosis not present

## 2021-06-10 DIAGNOSIS — Z48812 Encounter for surgical aftercare following surgery on the circulatory system: Secondary | ICD-10-CM | POA: Diagnosis not present

## 2021-06-10 DIAGNOSIS — Z7951 Long term (current) use of inhaled steroids: Secondary | ICD-10-CM | POA: Diagnosis not present

## 2021-06-10 DIAGNOSIS — Z89411 Acquired absence of right great toe: Secondary | ICD-10-CM | POA: Diagnosis not present

## 2021-06-10 DIAGNOSIS — L97528 Non-pressure chronic ulcer of other part of left foot with other specified severity: Secondary | ICD-10-CM | POA: Diagnosis not present

## 2021-06-10 DIAGNOSIS — I252 Old myocardial infarction: Secondary | ICD-10-CM | POA: Diagnosis not present

## 2021-06-10 DIAGNOSIS — Z794 Long term (current) use of insulin: Secondary | ICD-10-CM | POA: Diagnosis not present

## 2021-06-10 DIAGNOSIS — Z95820 Peripheral vascular angioplasty status with implants and grafts: Secondary | ICD-10-CM | POA: Diagnosis not present

## 2021-06-10 DIAGNOSIS — I10 Essential (primary) hypertension: Secondary | ICD-10-CM | POA: Diagnosis not present

## 2021-06-10 DIAGNOSIS — E559 Vitamin D deficiency, unspecified: Secondary | ICD-10-CM | POA: Diagnosis not present

## 2021-06-10 DIAGNOSIS — E7800 Pure hypercholesterolemia, unspecified: Secondary | ICD-10-CM | POA: Diagnosis not present

## 2021-06-12 DIAGNOSIS — Z7902 Long term (current) use of antithrombotics/antiplatelets: Secondary | ICD-10-CM | POA: Diagnosis not present

## 2021-06-12 DIAGNOSIS — I70348 Atherosclerosis of unspecified type of bypass graft(s) of the left leg with ulceration of other part of lower leg: Secondary | ICD-10-CM | POA: Diagnosis not present

## 2021-06-12 DIAGNOSIS — E7800 Pure hypercholesterolemia, unspecified: Secondary | ICD-10-CM | POA: Diagnosis not present

## 2021-06-12 DIAGNOSIS — Z4801 Encounter for change or removal of surgical wound dressing: Secondary | ICD-10-CM | POA: Diagnosis not present

## 2021-06-12 DIAGNOSIS — Z48812 Encounter for surgical aftercare following surgery on the circulatory system: Secondary | ICD-10-CM | POA: Diagnosis not present

## 2021-06-12 DIAGNOSIS — Z89411 Acquired absence of right great toe: Secondary | ICD-10-CM | POA: Diagnosis not present

## 2021-06-12 DIAGNOSIS — I10 Essential (primary) hypertension: Secondary | ICD-10-CM | POA: Diagnosis not present

## 2021-06-12 DIAGNOSIS — E559 Vitamin D deficiency, unspecified: Secondary | ICD-10-CM | POA: Diagnosis not present

## 2021-06-12 DIAGNOSIS — E1151 Type 2 diabetes mellitus with diabetic peripheral angiopathy without gangrene: Secondary | ICD-10-CM | POA: Diagnosis not present

## 2021-06-12 DIAGNOSIS — Z87891 Personal history of nicotine dependence: Secondary | ICD-10-CM | POA: Diagnosis not present

## 2021-06-12 DIAGNOSIS — Z794 Long term (current) use of insulin: Secondary | ICD-10-CM | POA: Diagnosis not present

## 2021-06-12 DIAGNOSIS — Z7951 Long term (current) use of inhaled steroids: Secondary | ICD-10-CM | POA: Diagnosis not present

## 2021-06-12 DIAGNOSIS — Z95820 Peripheral vascular angioplasty status with implants and grafts: Secondary | ICD-10-CM | POA: Diagnosis not present

## 2021-06-12 DIAGNOSIS — I252 Old myocardial infarction: Secondary | ICD-10-CM | POA: Diagnosis not present

## 2021-06-12 DIAGNOSIS — E11621 Type 2 diabetes mellitus with foot ulcer: Secondary | ICD-10-CM | POA: Diagnosis not present

## 2021-06-12 DIAGNOSIS — L97528 Non-pressure chronic ulcer of other part of left foot with other specified severity: Secondary | ICD-10-CM | POA: Diagnosis not present

## 2021-06-12 DIAGNOSIS — I2510 Atherosclerotic heart disease of native coronary artery without angina pectoris: Secondary | ICD-10-CM | POA: Diagnosis not present

## 2021-06-12 DIAGNOSIS — Z4781 Encounter for orthopedic aftercare following surgical amputation: Secondary | ICD-10-CM | POA: Diagnosis not present

## 2021-06-13 DIAGNOSIS — Z7951 Long term (current) use of inhaled steroids: Secondary | ICD-10-CM | POA: Diagnosis not present

## 2021-06-13 DIAGNOSIS — Z4801 Encounter for change or removal of surgical wound dressing: Secondary | ICD-10-CM | POA: Diagnosis not present

## 2021-06-13 DIAGNOSIS — I10 Essential (primary) hypertension: Secondary | ICD-10-CM | POA: Diagnosis not present

## 2021-06-13 DIAGNOSIS — Z87891 Personal history of nicotine dependence: Secondary | ICD-10-CM | POA: Diagnosis not present

## 2021-06-13 DIAGNOSIS — Z95820 Peripheral vascular angioplasty status with implants and grafts: Secondary | ICD-10-CM | POA: Diagnosis not present

## 2021-06-13 DIAGNOSIS — E559 Vitamin D deficiency, unspecified: Secondary | ICD-10-CM | POA: Diagnosis not present

## 2021-06-13 DIAGNOSIS — Z794 Long term (current) use of insulin: Secondary | ICD-10-CM | POA: Diagnosis not present

## 2021-06-13 DIAGNOSIS — I2510 Atherosclerotic heart disease of native coronary artery without angina pectoris: Secondary | ICD-10-CM | POA: Diagnosis not present

## 2021-06-13 DIAGNOSIS — I70348 Atherosclerosis of unspecified type of bypass graft(s) of the left leg with ulceration of other part of lower leg: Secondary | ICD-10-CM | POA: Diagnosis not present

## 2021-06-13 DIAGNOSIS — Z89411 Acquired absence of right great toe: Secondary | ICD-10-CM | POA: Diagnosis not present

## 2021-06-13 DIAGNOSIS — E7800 Pure hypercholesterolemia, unspecified: Secondary | ICD-10-CM | POA: Diagnosis not present

## 2021-06-13 DIAGNOSIS — E1151 Type 2 diabetes mellitus with diabetic peripheral angiopathy without gangrene: Secondary | ICD-10-CM | POA: Diagnosis not present

## 2021-06-13 DIAGNOSIS — L97528 Non-pressure chronic ulcer of other part of left foot with other specified severity: Secondary | ICD-10-CM | POA: Diagnosis not present

## 2021-06-13 DIAGNOSIS — Z7902 Long term (current) use of antithrombotics/antiplatelets: Secondary | ICD-10-CM | POA: Diagnosis not present

## 2021-06-13 DIAGNOSIS — I252 Old myocardial infarction: Secondary | ICD-10-CM | POA: Diagnosis not present

## 2021-06-13 DIAGNOSIS — Z4781 Encounter for orthopedic aftercare following surgical amputation: Secondary | ICD-10-CM | POA: Diagnosis not present

## 2021-06-13 DIAGNOSIS — Z48812 Encounter for surgical aftercare following surgery on the circulatory system: Secondary | ICD-10-CM | POA: Diagnosis not present

## 2021-06-13 DIAGNOSIS — E11621 Type 2 diabetes mellitus with foot ulcer: Secondary | ICD-10-CM | POA: Diagnosis not present

## 2021-06-18 DIAGNOSIS — Z09 Encounter for follow-up examination after completed treatment for conditions other than malignant neoplasm: Secondary | ICD-10-CM | POA: Diagnosis not present

## 2021-06-18 DIAGNOSIS — I7025 Atherosclerosis of native arteries of other extremities with ulceration: Secondary | ICD-10-CM | POA: Diagnosis not present

## 2021-06-18 DIAGNOSIS — Z95828 Presence of other vascular implants and grafts: Secondary | ICD-10-CM | POA: Diagnosis not present

## 2021-06-19 DIAGNOSIS — I10 Essential (primary) hypertension: Secondary | ICD-10-CM | POA: Diagnosis not present

## 2021-06-19 DIAGNOSIS — L97509 Non-pressure chronic ulcer of other part of unspecified foot with unspecified severity: Secondary | ICD-10-CM | POA: Diagnosis not present

## 2021-06-19 DIAGNOSIS — E11621 Type 2 diabetes mellitus with foot ulcer: Secondary | ICD-10-CM | POA: Diagnosis not present

## 2021-06-19 DIAGNOSIS — I2510 Atherosclerotic heart disease of native coronary artery without angina pectoris: Secondary | ICD-10-CM | POA: Diagnosis not present

## 2021-08-14 DIAGNOSIS — I10 Essential (primary) hypertension: Secondary | ICD-10-CM | POA: Diagnosis not present

## 2021-08-14 DIAGNOSIS — L97509 Non-pressure chronic ulcer of other part of unspecified foot with unspecified severity: Secondary | ICD-10-CM | POA: Diagnosis not present

## 2021-08-14 DIAGNOSIS — E11621 Type 2 diabetes mellitus with foot ulcer: Secondary | ICD-10-CM | POA: Diagnosis not present

## 2021-09-25 DIAGNOSIS — B351 Tinea unguium: Secondary | ICD-10-CM | POA: Diagnosis not present

## 2021-09-25 DIAGNOSIS — E1149 Type 2 diabetes mellitus with other diabetic neurological complication: Secondary | ICD-10-CM | POA: Diagnosis not present

## 2021-10-21 IMAGING — CR DG HIP (WITH OR WITHOUT PELVIS) 2-3V*L*
1 series · 3 of 3 positions shown · non-contrast
Comparison: None.

CLINICAL DATA: Chronic left hip pain.

EXAM:
DG HIP (WITH OR WITHOUT PELVIS) 2-3V LEFT

[Series 1: dg hip unilat w or w/o pelvis 2-3 views  · non-contrast · 0.14mm/px · 3 of 3 slices shown]
[im 1/3]
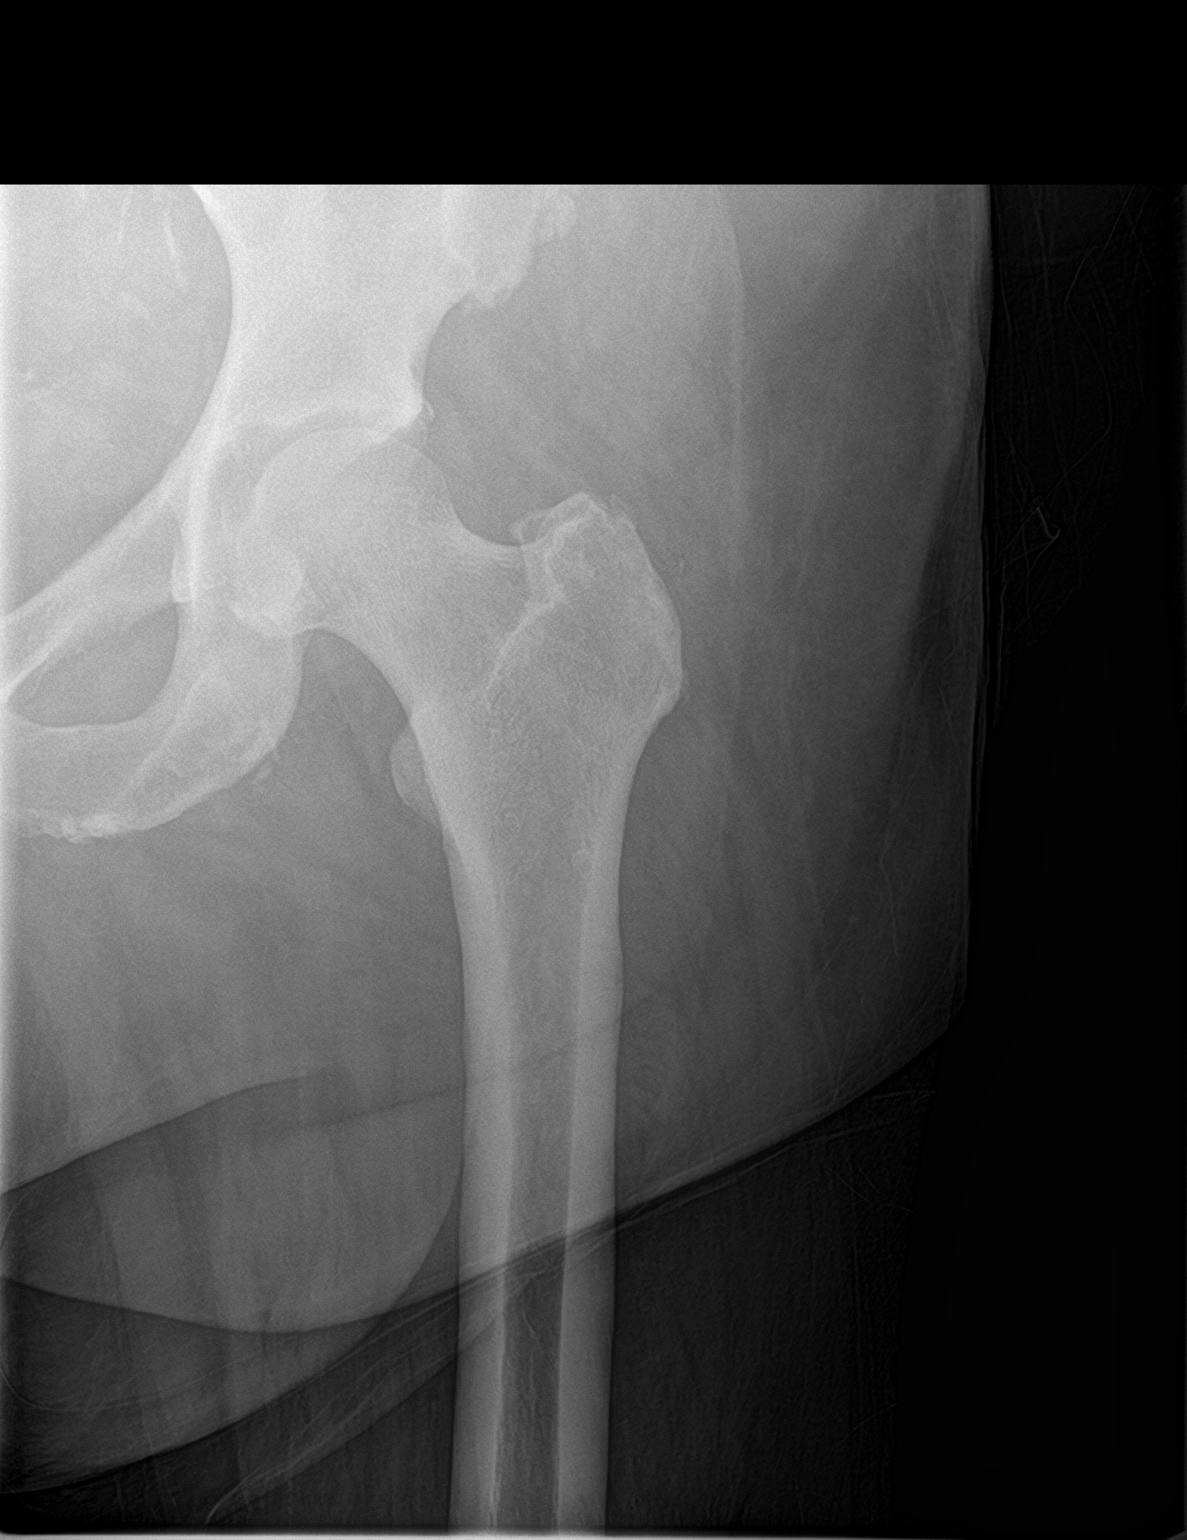
[im 2/3]
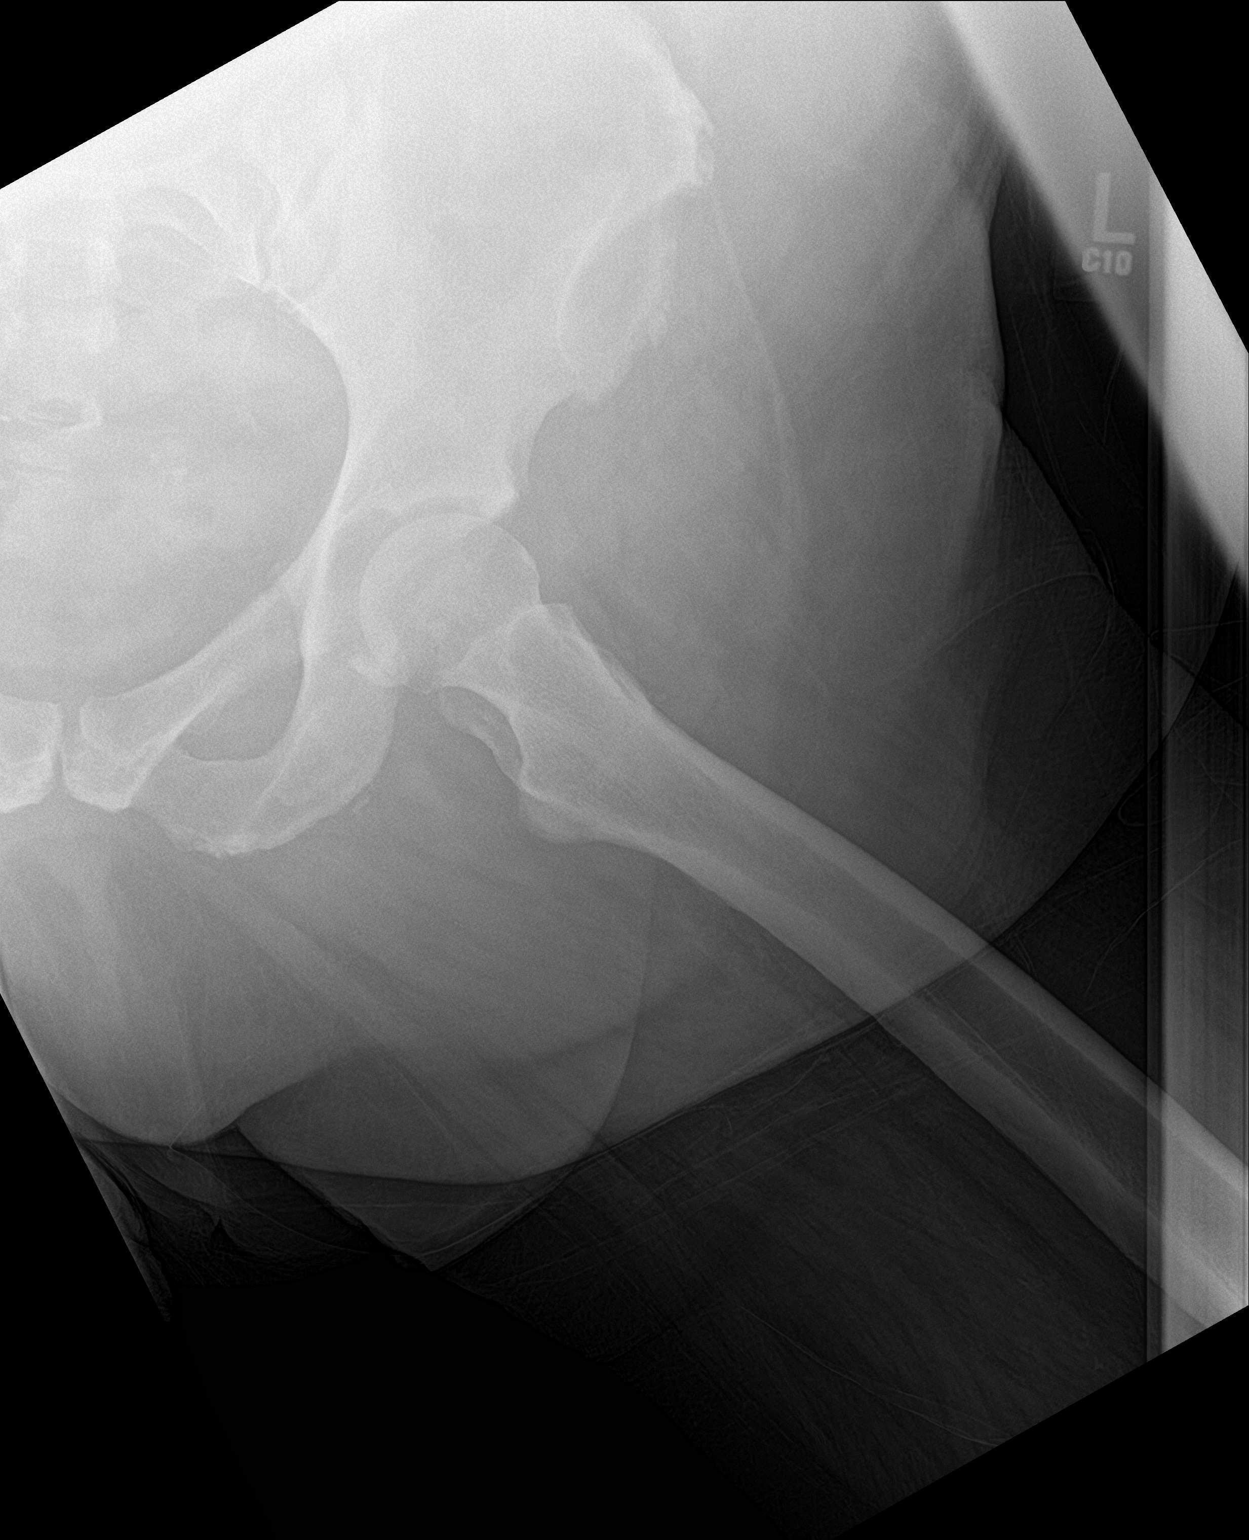
[im 3/3]
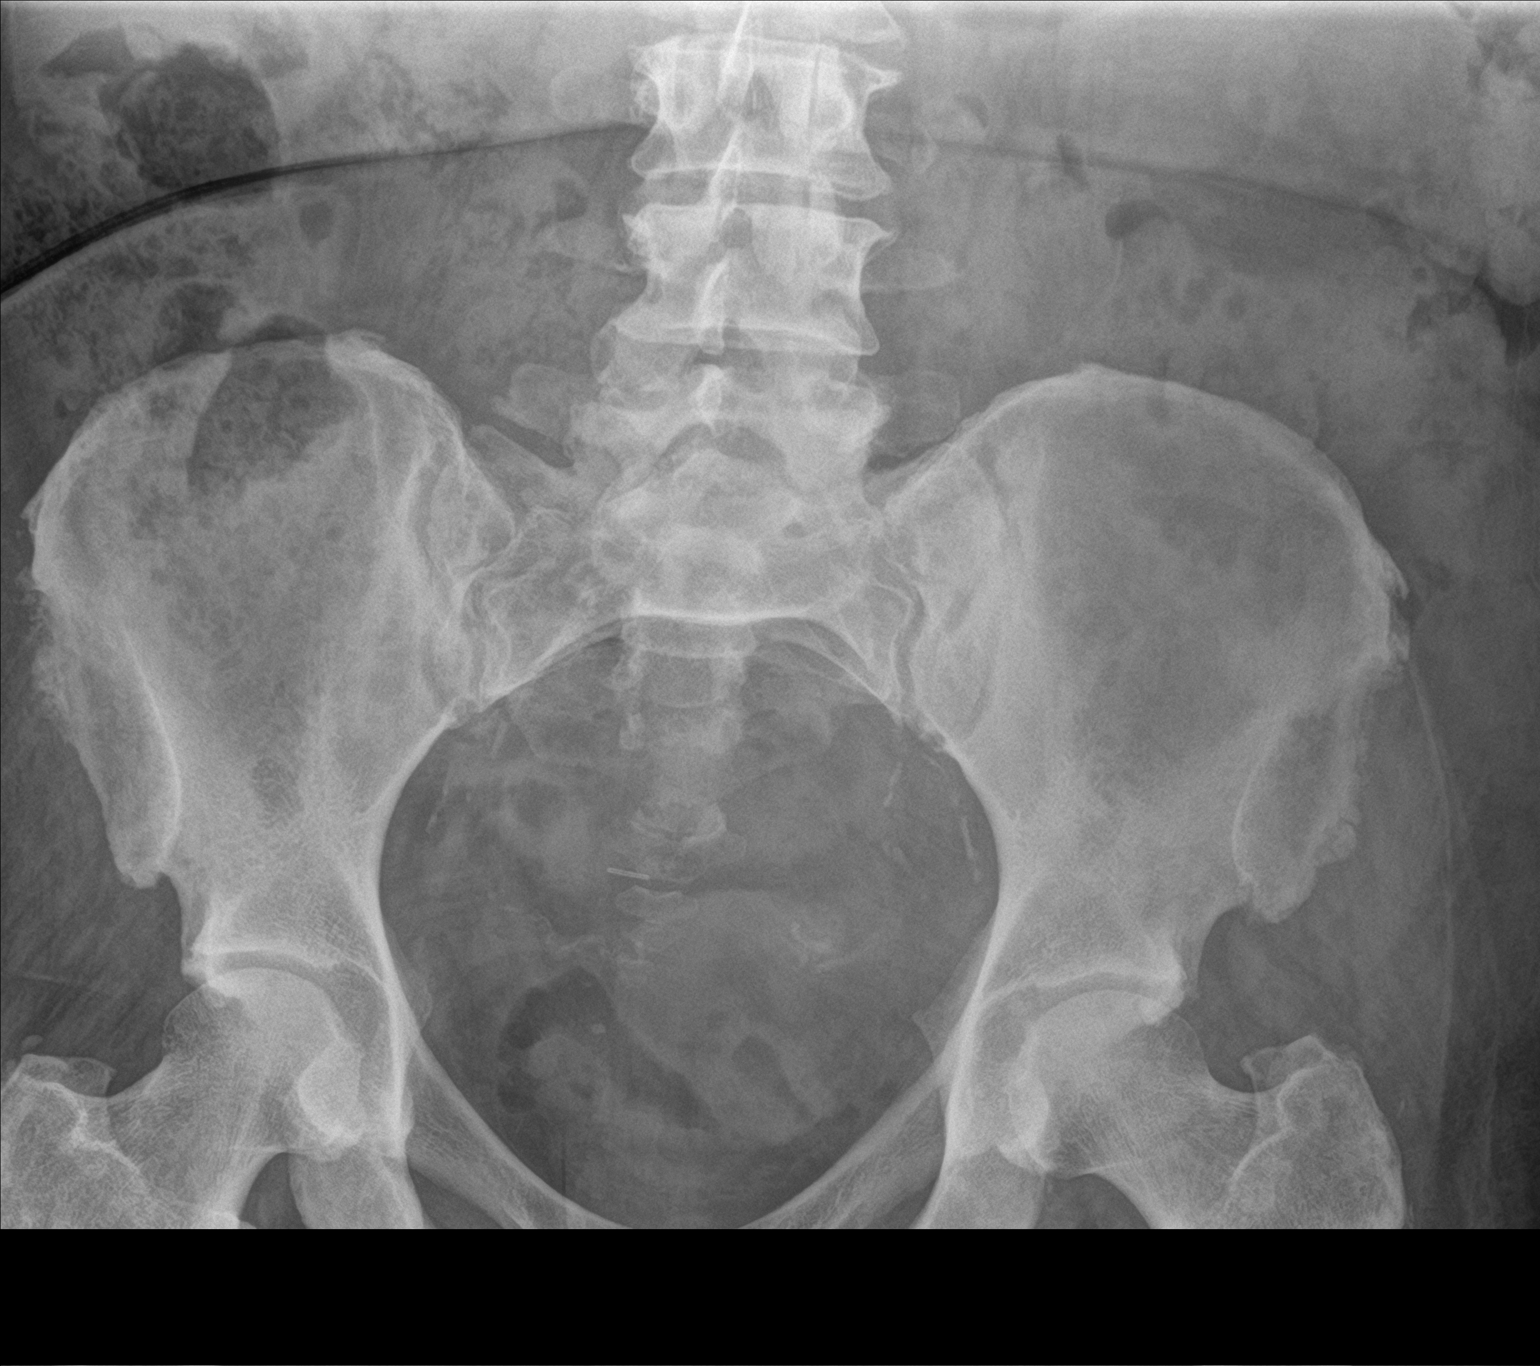

[3 of 3 positions shown; findings below may reference images not displayed]

FINDINGS: There is no evidence of hip fracture or dislocation. There is no
evidence of significant arthropathy or other focal bone abnormality.
IMPRESSION: Negative.

## 2021-10-21 IMAGING — CR DG SHOULDER 2+V*L*
1 series · 3 of 3 positions shown · non-contrast
Comparison: None.

CLINICAL DATA: Chronic left shoulder pain.

EXAM:
LEFT SHOULDER - 2+ VIEW

[Series 1: dg shoulder left · 0.14mm/px · 3 of 3 slices shown]
[im 1/3]
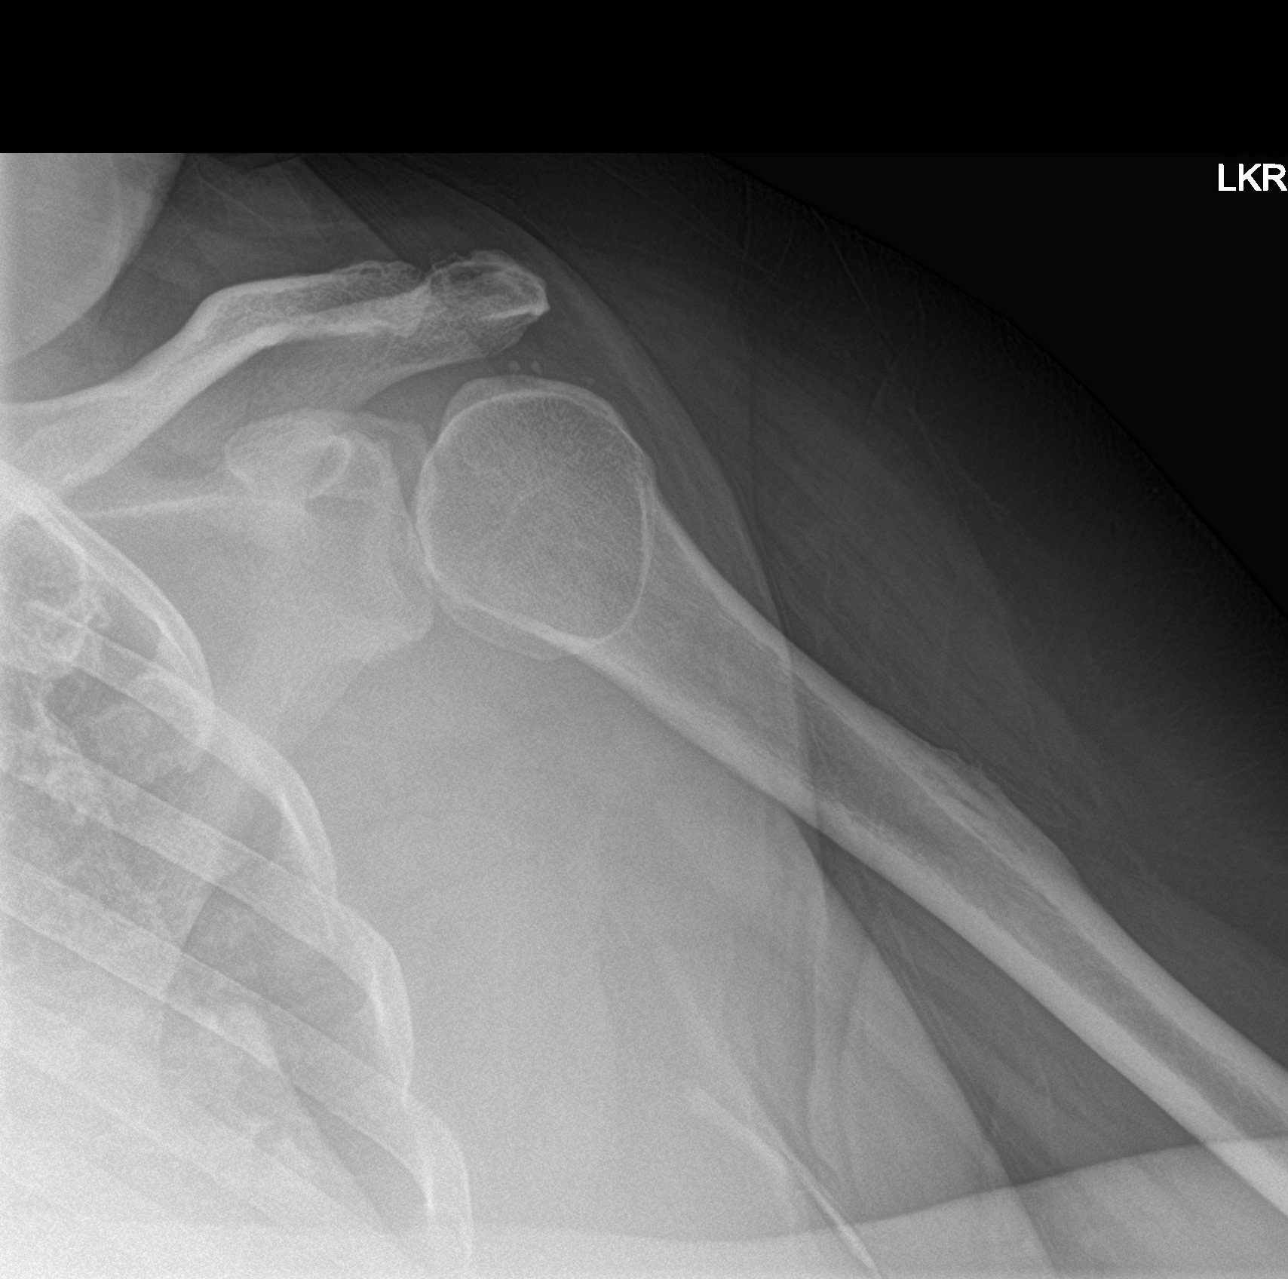
[im 2/3]
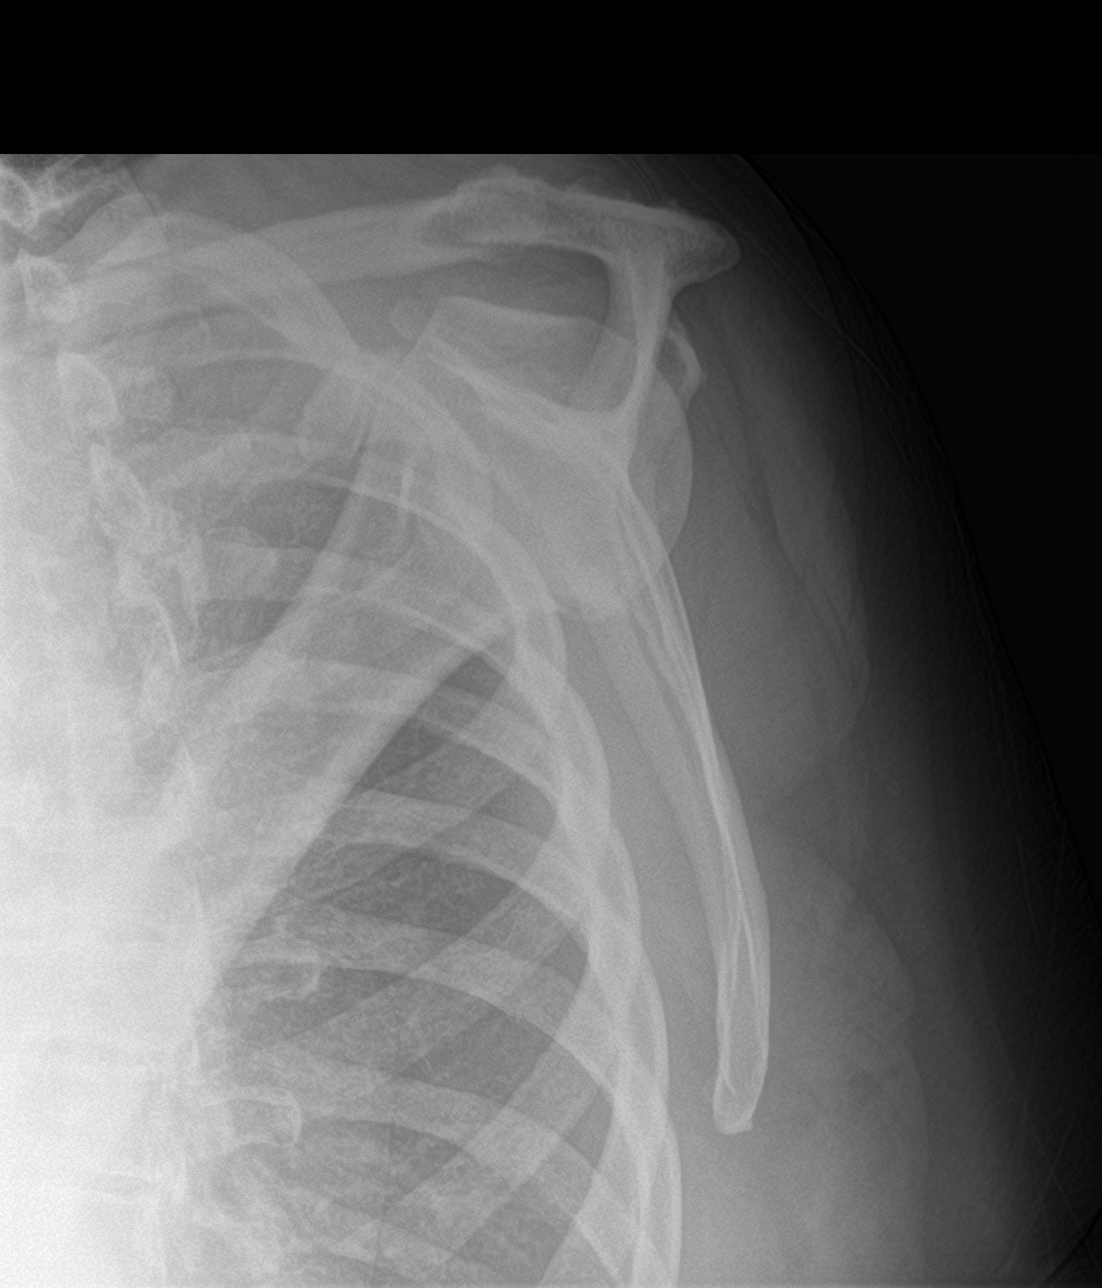
[im 3/3]
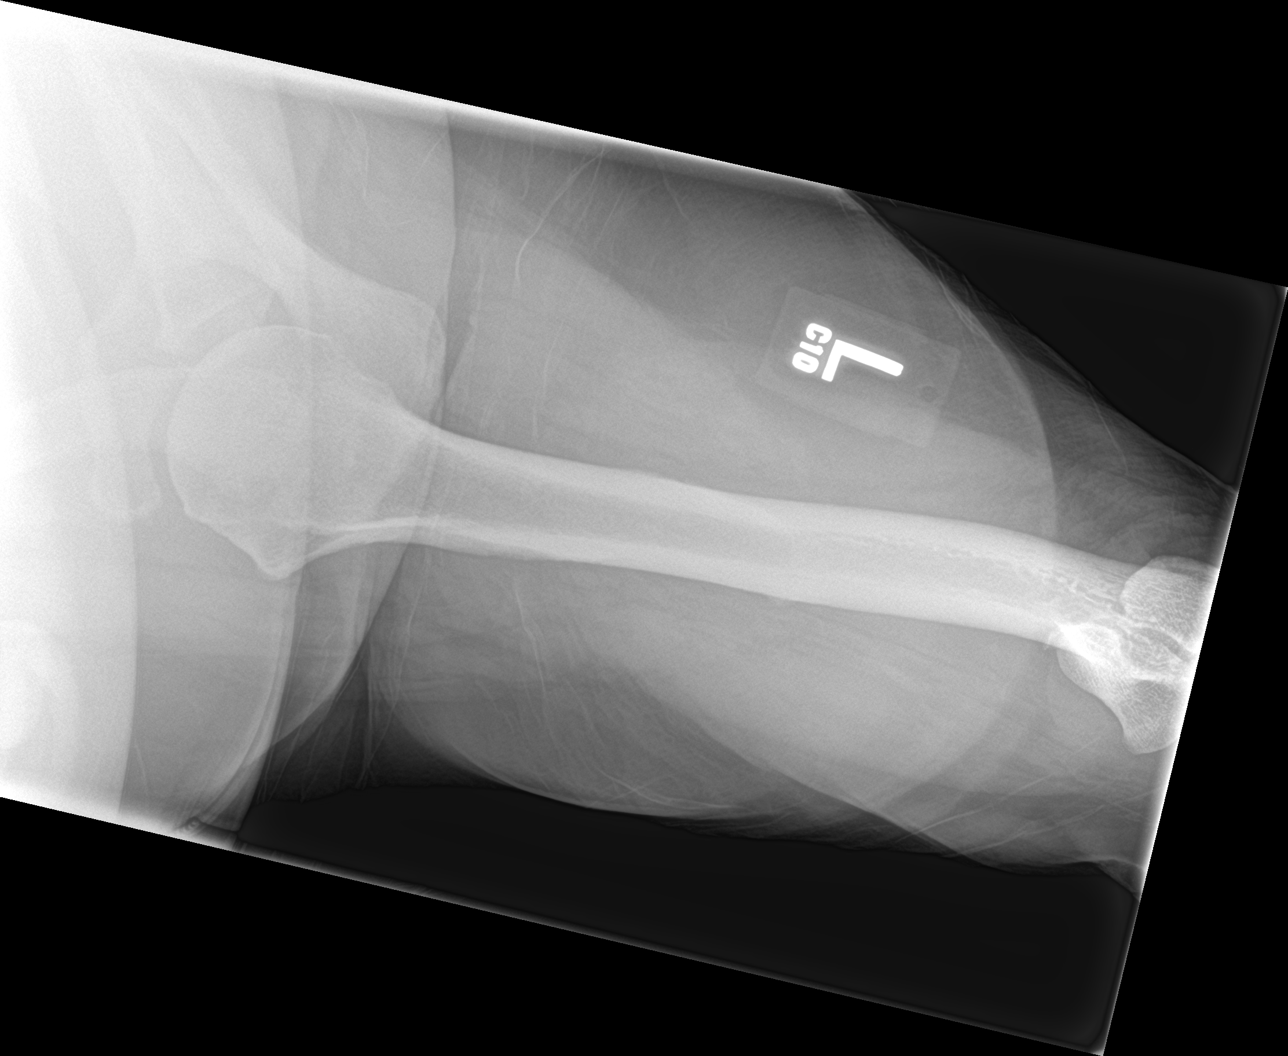

[3 of 3 positions shown; findings below may reference images not displayed]

FINDINGS: There is no evidence of fracture or dislocation. Mild degenerative
changes seen involving the left acromioclavicular joint and left
acromion. Subcentimeter soft tissue calcifications are seen adjacent
to the greater tubercle of the left humeral head.
IMPRESSION: Mild degenerative changes involving the left acromioclavicular joint
with additional findings suggestive of mild chronic left bursitis.

## 2021-10-21 IMAGING — CR DG LUMBAR SPINE COMPLETE W/ BEND
1 series · 7 of 7 positions shown · non-contrast
Comparison: None.

CLINICAL DATA: Lower back pain.

EXAM:
LUMBAR SPINE - COMPLETE WITH BENDING VIEWS

[Series 1: dg lumbar spine complete w/bend 6+v · 0.14mm/px · 7 of 7 slices shown]
[im 1/7]
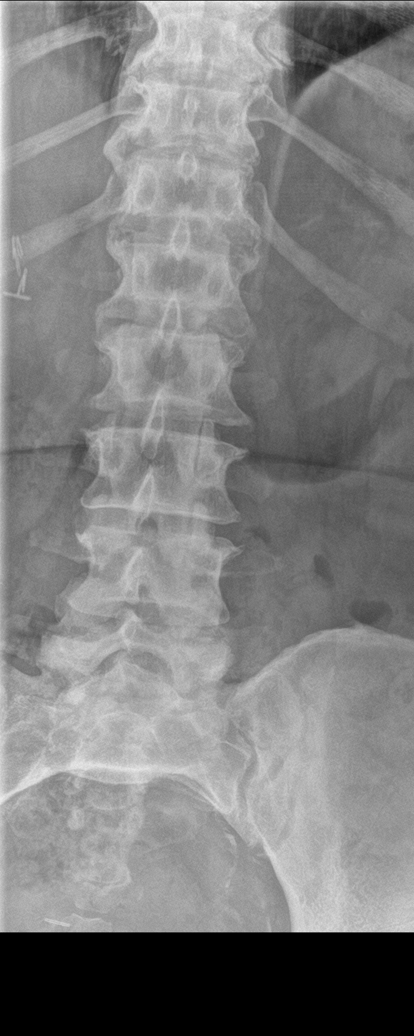
[im 2/7]
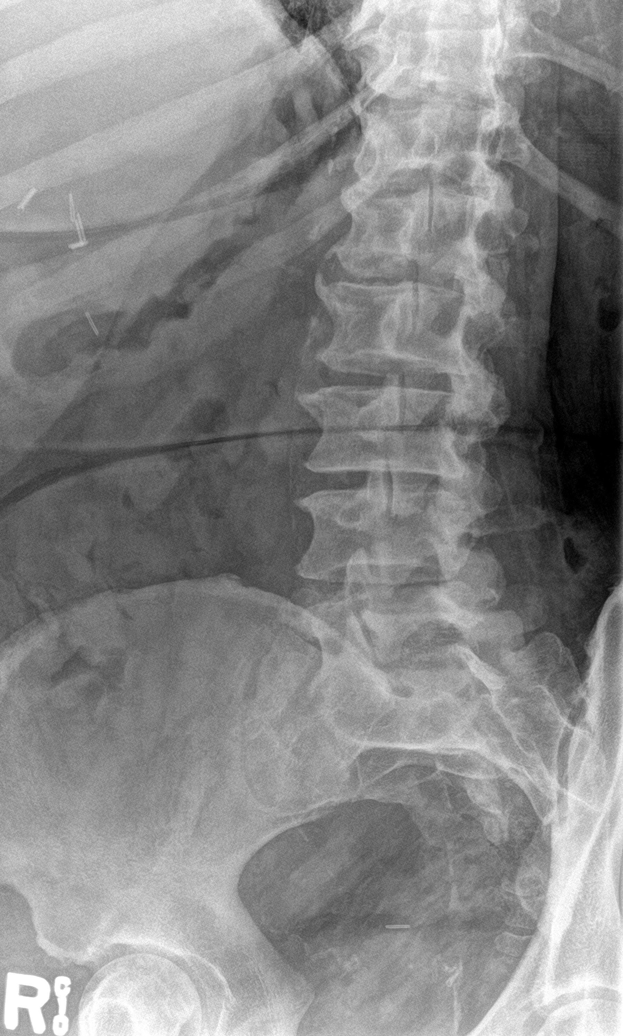
[im 3/7]
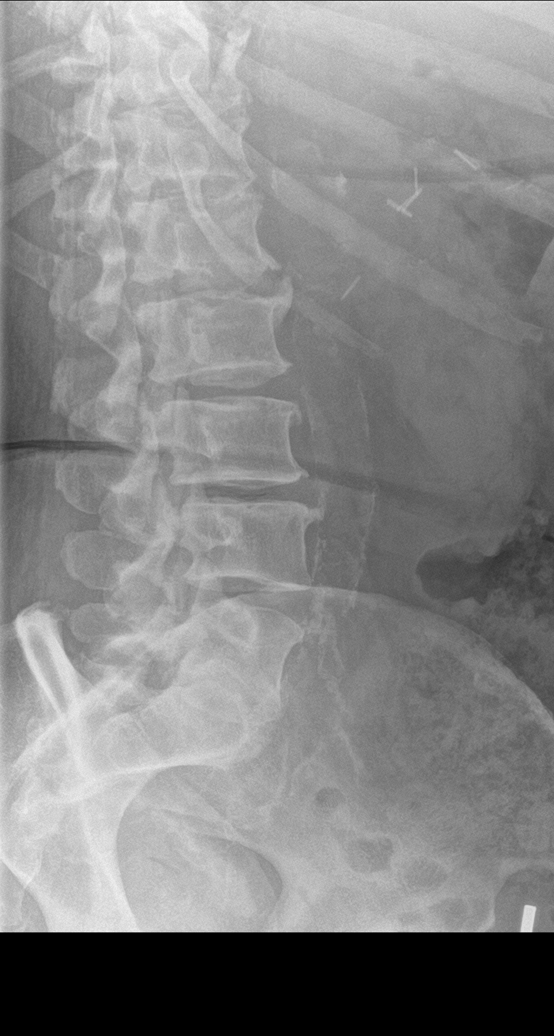
[im 4/7]
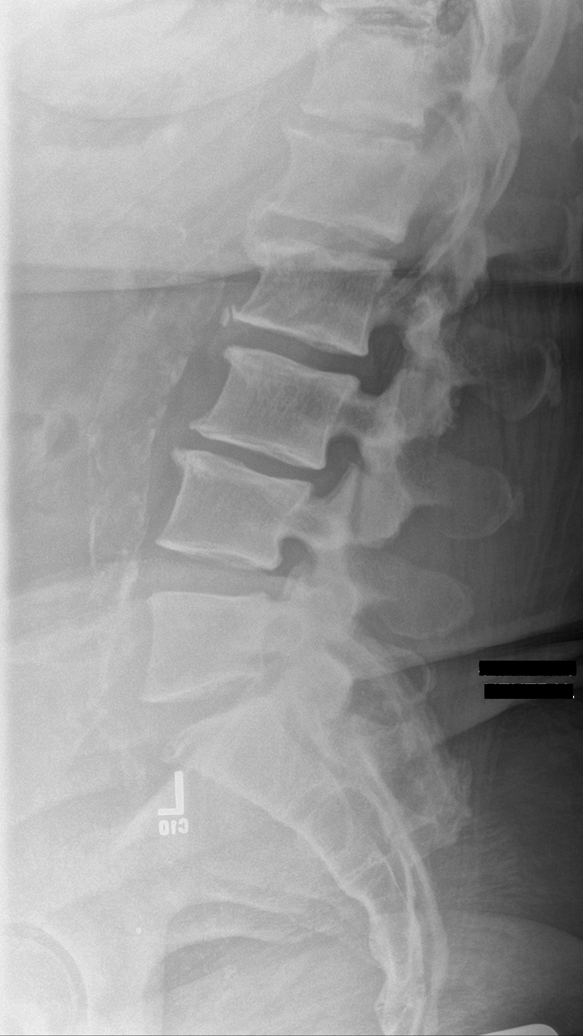
[im 5/7]
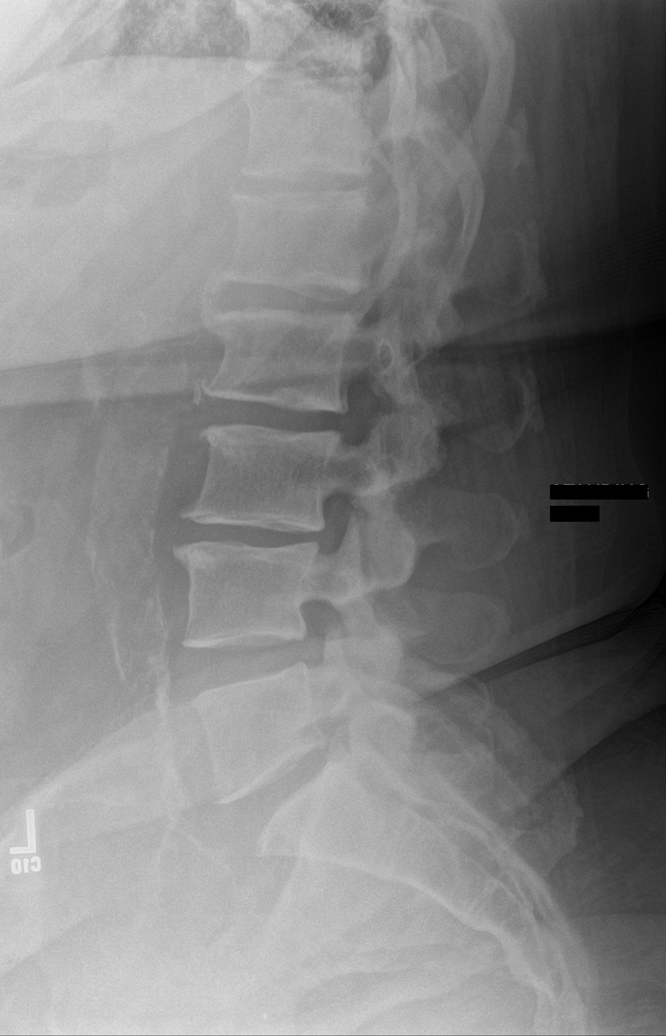
[im 6/7]
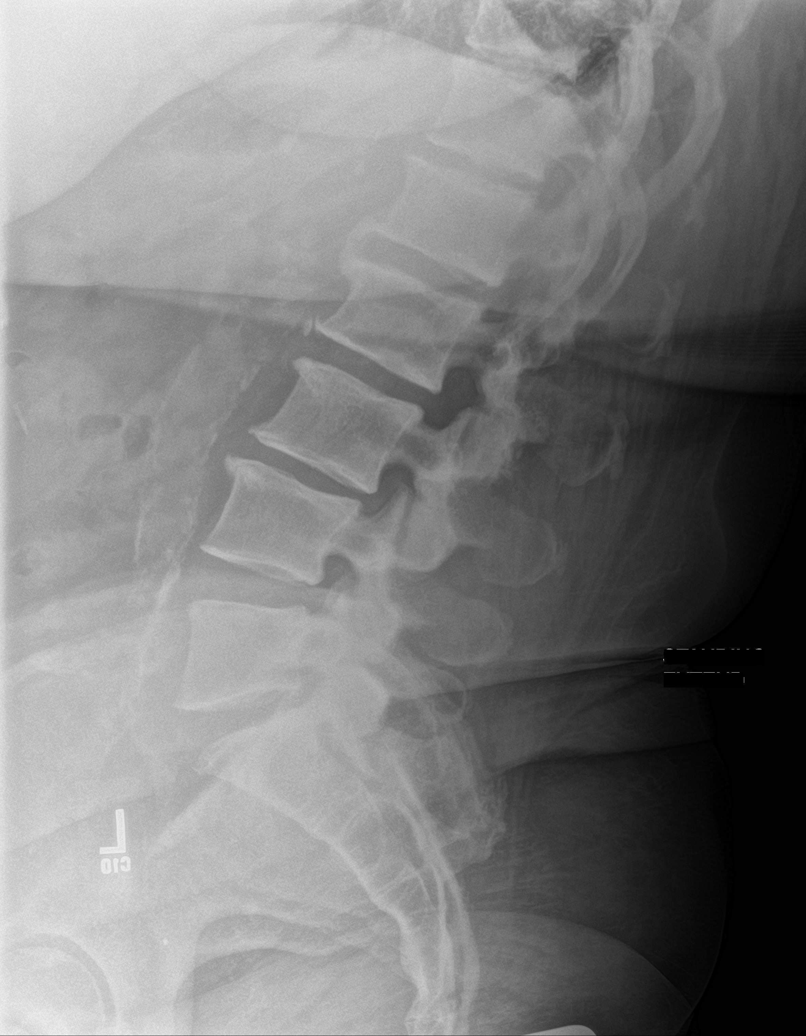
[im 7/7]
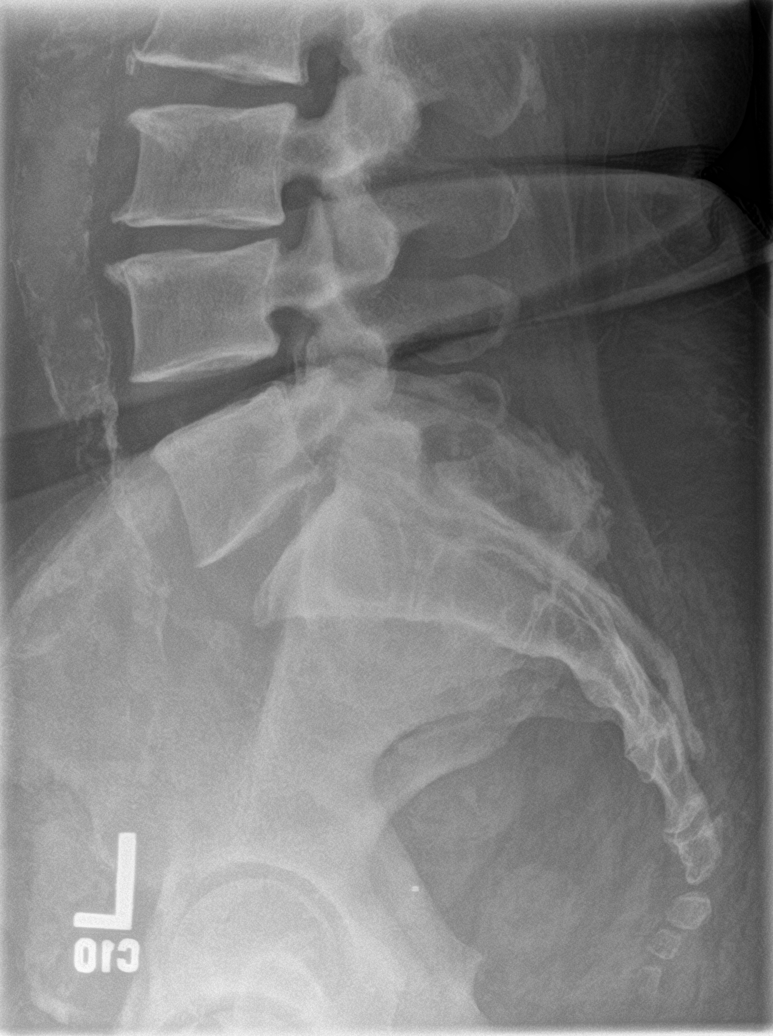

[7 of 7 positions shown; findings below may reference images not displayed]

FINDINGS: There is no evidence of lumbar spine fracture. Alignment is normal.
Mild to moderate severity endplate sclerosis is seen at the levels
of L1-L2, L2-L3 and L3-L4. Mild multilevel intervertebral disc space
narrowing is seen. There is marked severity calcification of the
abdominal aorta. Radiopaque surgical clips are seen within the right
upper quadrant.
IMPRESSION: Mild to moderate multilevel degenerative disc disease.

## 2021-10-31 DIAGNOSIS — Z794 Long term (current) use of insulin: Secondary | ICD-10-CM | POA: Diagnosis not present

## 2021-10-31 DIAGNOSIS — Z780 Asymptomatic menopausal state: Secondary | ICD-10-CM | POA: Diagnosis not present

## 2021-10-31 DIAGNOSIS — R0683 Snoring: Secondary | ICD-10-CM | POA: Diagnosis not present

## 2021-10-31 DIAGNOSIS — K529 Noninfective gastroenteritis and colitis, unspecified: Secondary | ICD-10-CM | POA: Diagnosis not present

## 2021-10-31 DIAGNOSIS — I739 Peripheral vascular disease, unspecified: Secondary | ICD-10-CM | POA: Diagnosis not present

## 2021-10-31 DIAGNOSIS — I10 Essential (primary) hypertension: Secondary | ICD-10-CM | POA: Diagnosis not present

## 2021-10-31 DIAGNOSIS — Z7689 Persons encountering health services in other specified circumstances: Secondary | ICD-10-CM | POA: Diagnosis not present

## 2021-10-31 DIAGNOSIS — E11621 Type 2 diabetes mellitus with foot ulcer: Secondary | ICD-10-CM | POA: Diagnosis not present

## 2021-10-31 DIAGNOSIS — Z1231 Encounter for screening mammogram for malignant neoplasm of breast: Secondary | ICD-10-CM | POA: Diagnosis not present

## 2021-10-31 DIAGNOSIS — E1149 Type 2 diabetes mellitus with other diabetic neurological complication: Secondary | ICD-10-CM | POA: Diagnosis not present

## 2021-12-03 DIAGNOSIS — Z7189 Other specified counseling: Secondary | ICD-10-CM | POA: Diagnosis not present

## 2021-12-03 DIAGNOSIS — Z298 Encounter for other specified prophylactic measures: Secondary | ICD-10-CM | POA: Diagnosis not present

## 2021-12-03 DIAGNOSIS — E1149 Type 2 diabetes mellitus with other diabetic neurological complication: Secondary | ICD-10-CM | POA: Diagnosis not present

## 2021-12-03 DIAGNOSIS — I2510 Atherosclerotic heart disease of native coronary artery without angina pectoris: Secondary | ICD-10-CM | POA: Diagnosis not present

## 2021-12-03 DIAGNOSIS — G8929 Other chronic pain: Secondary | ICD-10-CM | POA: Diagnosis not present

## 2021-12-03 DIAGNOSIS — M5441 Lumbago with sciatica, right side: Secondary | ICD-10-CM | POA: Diagnosis not present

## 2021-12-03 DIAGNOSIS — I10 Essential (primary) hypertension: Secondary | ICD-10-CM | POA: Diagnosis not present

## 2022-06-05 ENCOUNTER — Other Ambulatory Visit: Payer: Self-pay | Admitting: Primary Care

## 2022-06-05 DIAGNOSIS — Z1231 Encounter for screening mammogram for malignant neoplasm of breast: Secondary | ICD-10-CM

## 2022-12-30 DIAGNOSIS — I739 Peripheral vascular disease, unspecified: Secondary | ICD-10-CM | POA: Diagnosis not present

## 2022-12-30 DIAGNOSIS — E1149 Type 2 diabetes mellitus with other diabetic neurological complication: Secondary | ICD-10-CM | POA: Diagnosis not present

## 2022-12-30 DIAGNOSIS — E118 Type 2 diabetes mellitus with unspecified complications: Secondary | ICD-10-CM | POA: Diagnosis not present

## 2022-12-30 DIAGNOSIS — Z794 Long term (current) use of insulin: Secondary | ICD-10-CM | POA: Diagnosis not present

## 2023-01-14 DIAGNOSIS — E118 Type 2 diabetes mellitus with unspecified complications: Secondary | ICD-10-CM | POA: Diagnosis not present

## 2023-01-14 DIAGNOSIS — Z794 Long term (current) use of insulin: Secondary | ICD-10-CM | POA: Diagnosis not present

## 2023-01-14 DIAGNOSIS — I739 Peripheral vascular disease, unspecified: Secondary | ICD-10-CM | POA: Diagnosis not present

## 2023-01-15 DIAGNOSIS — E782 Mixed hyperlipidemia: Secondary | ICD-10-CM | POA: Diagnosis not present

## 2023-01-15 DIAGNOSIS — B80 Enterobiasis: Secondary | ICD-10-CM | POA: Diagnosis not present

## 2023-01-15 DIAGNOSIS — K529 Noninfective gastroenteritis and colitis, unspecified: Secondary | ICD-10-CM | POA: Diagnosis not present

## 2023-01-15 DIAGNOSIS — E1149 Type 2 diabetes mellitus with other diabetic neurological complication: Secondary | ICD-10-CM | POA: Diagnosis not present

## 2023-01-15 DIAGNOSIS — Z23 Encounter for immunization: Secondary | ICD-10-CM | POA: Diagnosis not present

## 2023-01-15 DIAGNOSIS — K219 Gastro-esophageal reflux disease without esophagitis: Secondary | ICD-10-CM | POA: Diagnosis not present

## 2023-02-04 DIAGNOSIS — M5416 Radiculopathy, lumbar region: Secondary | ICD-10-CM | POA: Diagnosis not present

## 2023-02-04 DIAGNOSIS — E7800 Pure hypercholesterolemia, unspecified: Secondary | ICD-10-CM | POA: Diagnosis not present

## 2023-02-04 DIAGNOSIS — E118 Type 2 diabetes mellitus with unspecified complications: Secondary | ICD-10-CM | POA: Diagnosis not present

## 2023-02-04 DIAGNOSIS — I10 Essential (primary) hypertension: Secondary | ICD-10-CM | POA: Diagnosis not present

## 2023-02-04 DIAGNOSIS — Z794 Long term (current) use of insulin: Secondary | ICD-10-CM | POA: Diagnosis not present

## 2023-05-01 DIAGNOSIS — E559 Vitamin D deficiency, unspecified: Secondary | ICD-10-CM | POA: Diagnosis not present

## 2023-05-01 DIAGNOSIS — K529 Noninfective gastroenteritis and colitis, unspecified: Secondary | ICD-10-CM | POA: Diagnosis not present

## 2023-05-01 DIAGNOSIS — E782 Mixed hyperlipidemia: Secondary | ICD-10-CM | POA: Diagnosis not present

## 2023-05-01 DIAGNOSIS — E1149 Type 2 diabetes mellitus with other diabetic neurological complication: Secondary | ICD-10-CM | POA: Diagnosis not present

## 2023-07-02 DIAGNOSIS — I10 Essential (primary) hypertension: Secondary | ICD-10-CM | POA: Diagnosis not present

## 2023-07-02 DIAGNOSIS — M5416 Radiculopathy, lumbar region: Secondary | ICD-10-CM | POA: Diagnosis not present

## 2023-07-02 DIAGNOSIS — M79604 Pain in right leg: Secondary | ICD-10-CM | POA: Diagnosis not present

## 2023-07-02 DIAGNOSIS — I2510 Atherosclerotic heart disease of native coronary artery without angina pectoris: Secondary | ICD-10-CM | POA: Diagnosis not present

## 2023-07-02 DIAGNOSIS — I739 Peripheral vascular disease, unspecified: Secondary | ICD-10-CM | POA: Diagnosis not present

## 2023-07-02 DIAGNOSIS — E1149 Type 2 diabetes mellitus with other diabetic neurological complication: Secondary | ICD-10-CM | POA: Diagnosis not present

## 2023-08-13 DIAGNOSIS — M25511 Pain in right shoulder: Secondary | ICD-10-CM | POA: Diagnosis not present

## 2023-08-13 DIAGNOSIS — E1149 Type 2 diabetes mellitus with other diabetic neurological complication: Secondary | ICD-10-CM | POA: Diagnosis not present

## 2023-08-13 DIAGNOSIS — I10 Essential (primary) hypertension: Secondary | ICD-10-CM | POA: Diagnosis not present

## 2023-08-13 DIAGNOSIS — K529 Noninfective gastroenteritis and colitis, unspecified: Secondary | ICD-10-CM | POA: Diagnosis not present

## 2023-08-13 DIAGNOSIS — I739 Peripheral vascular disease, unspecified: Secondary | ICD-10-CM | POA: Diagnosis not present

## 2023-08-20 DIAGNOSIS — R197 Diarrhea, unspecified: Secondary | ICD-10-CM | POA: Diagnosis not present

## 2023-08-20 DIAGNOSIS — K219 Gastro-esophageal reflux disease without esophagitis: Secondary | ICD-10-CM | POA: Diagnosis not present

## 2023-08-20 DIAGNOSIS — Z8639 Personal history of other endocrine, nutritional and metabolic disease: Secondary | ICD-10-CM | POA: Diagnosis not present

## 2024-03-17 DEATH — deceased

## 2024-03-28 NOTE — Progress Notes (Signed)
 Michelle Kim                                          MRN: [id number]   03/28/2024   The [facility] Quality Team Specialist reviewed this patient medical record for the purposes of chart review for care gap closure. The following were reviewed: chart review for care gap closure-glycemic status assessment.    [facility] Quality Team
